# Patient Record
Sex: Male | Born: 1990 | Race: White | Hispanic: No | Marital: Married | Smoking: Former smoker
Health system: Southern US, Community
[De-identification: ages and names within clinical notes are randomized; demographics above are authoritative.]

## PROBLEM LIST (undated history)

## (undated) DIAGNOSIS — K219 Gastro-esophageal reflux disease without esophagitis: Secondary | ICD-10-CM

## (undated) HISTORY — DX: Gastro-esophageal reflux disease without esophagitis: K21.9

---

## 2018-08-21 DIAGNOSIS — F17200 Nicotine dependence, unspecified, uncomplicated: Secondary | ICD-10-CM | POA: Insufficient documentation

## 2018-08-21 DIAGNOSIS — R2232 Localized swelling, mass and lump, left upper limb: Secondary | ICD-10-CM | POA: Insufficient documentation

## 2018-08-22 ENCOUNTER — Emergency Department (HOSPITAL_COMMUNITY)
Admission: EM | Admit: 2018-08-22 | Discharge: 2018-08-22 | Disposition: A | Discharge status: Home / Self Care | Payer: Self-pay | Attending: Emergency Medicine | Admitting: Emergency Medicine

## 2018-08-22 ENCOUNTER — Emergency Department (HOSPITAL_COMMUNITY): Payer: Self-pay

## 2018-08-22 ENCOUNTER — Other Ambulatory Visit: Payer: Self-pay

## 2018-08-22 ENCOUNTER — Encounter (HOSPITAL_COMMUNITY): Payer: Self-pay | Admitting: *Deleted

## 2018-08-22 DIAGNOSIS — M7989 Other specified soft tissue disorders: Secondary | ICD-10-CM

## 2018-08-22 HISTORY — DX: Gastro-esophageal reflux disease without esophagitis: K21.9

## 2018-08-22 NOTE — ED Provider Notes (Signed)
[facility] EMERGENCY DEPARTMENT Provider Note   CSN: [id number] Arrival date & time: 08/21/18  2356     History   Chief Complaint Chief Complaint  Patient presents with  . Hand Pain    HPI Jeremiah Roberts is a 27 y.o. male.  Patient presents with pain and swelling to his left thumb that is progressively worsening over the past 1 month.  States he had a saw injury to his thumb many years ago in [place] that was surgically reconstructed.  Since then he has had a firm area of "scar tissue" to the palmar surface of his left thumb.  Over the past month at least this is gotten progressively larger in size and become sensitive to touch.  No bleeding or drainage.  No fever.  No focal weakness, numbness or tingling.  Dates he did accidentally hit this time with a hammer several weeks ago but did not injure this particular area.  No pain with range of motion.  The history is provided by the patient.  Hand Pain  Pertinent negatives include no chest pain, no abdominal pain and no shortness of breath.    Past Medical History:  Diagnosis Date  . Acid reflux     There are no active problems to display for this patient.   History reviewed. No pertinent surgical history.      Home Medications    Prior to Admission medications   Not on File    Family History History reviewed. No pertinent family history.  Social History Social History   Tobacco Use  . Smoking status: Current Every Day Smoker    Types: Cigars  . Smokeless tobacco: Never Used  Substance Use Topics  . Alcohol use: Yes    Comment: occasionally  . Drug use: Never     Allergies   Patient has no known allergies.   Review of Systems Review of Systems  Constitutional: Negative for activity change, appetite change and fever.  HENT: Negative for congestion.   Eyes: Negative for visual disturbance.  Respiratory: Negative for cough, chest tightness and shortness of breath.   Cardiovascular: Negative for chest  pain.  Gastrointestinal: Negative for abdominal pain, nausea and vomiting.  Genitourinary: Negative for dysuria, hematuria and urgency.  Musculoskeletal: Positive for arthralgias and myalgias. Negative for neck stiffness.  Skin: Positive for wound.  Neurological: Negative for dizziness, weakness, light-headedness and numbness.    all other systems are negative except as noted in the HPI and PMH.    Physical Exam Updated Vital Signs BP (!) 118/93 (BP Location: Right Arm)   Pulse 62   Temp 98.1 F (36.7 C) (Oral)   Resp 16   Ht 6\' 1"  (1.854 m)   SpO2 99%   Physical Exam  Constitutional: He is oriented to person, place, and time. He appears well-developed and well-nourished. No distress.  HENT:  Head: Normocephalic and atraumatic.  Mouth/Throat: Oropharynx is clear and moist. No oropharyngeal exudate.  Eyes: Pupils are equal, round, and reactive to light. Conjunctivae and EOM are normal.  Neck: Normal range of motion. Neck supple.  No meningismus.  Cardiovascular: Normal rate, regular rhythm, normal heart sounds and intact distal pulses.  No murmur heard. Pulmonary/Chest: Effort normal and breath sounds normal. No respiratory distress.  Abdominal: Soft. There is no tenderness. There is no rebound and no guarding.  Musculoskeletal: Normal range of motion. He exhibits tenderness. He exhibits no edema.  Left palmar thumb has a 0.5 cm firm nodule versus cyst.  There is  no erythema or fluctuance. Full range of motion of IP and MCP joint. Intact radial pulse.  Neurological: He is alert and oriented to person, place, and time. No cranial nerve deficit. He exhibits normal muscle tone. Coordination normal.  No ataxia on finger to nose bilaterally. No pronator drift. 5/5 strength throughout. CN 2-12 intact.Equal grip strength. Sensation intact.   Skin: Skin is warm.  Psychiatric: He has a normal mood and affect. His behavior is normal.  Nursing note and vitals reviewed.      ED  Treatments / Results  Labs (all labs ordered are listed, but only abnormal results are displayed) Labs Reviewed - No data to display  EKG None  Radiology No results found.  Procedures Procedures (including critical care time)  Medications Ordered in ED Medications - No data to display   Initial Impression / Assessment and Plan / ED Course  I have reviewed the triage vital signs and the nursing notes.  Pertinent labs & imaging results that were available during my care of the patient were reviewed by me and considered in my medical decision making (see chart for details).    Pain and swelling to left thumb after having surgery many years ago.  There is no fluctuance or erythema.  Neurovascularly intact.  Exam appears consistent with likely scar tissue versus cyst.  Does not appear to be abscess. X-ray shows no foreign body.  Discussed follow-up with hand surgery for elective excision.  Return precautions discussed.  Final Clinical Impressions(s) / ED Diagnoses   Final diagnoses:  Swelling of left thumb    ED Discharge Orders    None       Bjorn Hallas, Jeannett Senior, MD 08/22/18 0145

## 2018-08-22 NOTE — ED Triage Notes (Signed)
Pt c/o pain to right thumb; pt has swelling to right thumb

## 2018-08-22 NOTE — Discharge Instructions (Addendum)
Follow-up with the hand doctor if you want this cyst removed.  There is no evidence of infection.  Your x-ray does not show any fractures dislocation.  Return to the ED with new or worsening symptoms.

## 2018-12-30 ENCOUNTER — Emergency Department (HOSPITAL_COMMUNITY)
Admission: EM | Admit: 2018-12-30 | Discharge: 2018-12-30 | Disposition: A | Discharge status: Home / Self Care | Payer: Self-pay | Attending: Emergency Medicine | Admitting: Emergency Medicine

## 2018-12-30 ENCOUNTER — Other Ambulatory Visit: Payer: Self-pay

## 2018-12-30 ENCOUNTER — Encounter (HOSPITAL_COMMUNITY): Payer: Self-pay | Admitting: Emergency Medicine

## 2018-12-30 DIAGNOSIS — M79604 Pain in right leg: Secondary | ICD-10-CM

## 2018-12-30 DIAGNOSIS — F17290 Nicotine dependence, other tobacco product, uncomplicated: Secondary | ICD-10-CM | POA: Insufficient documentation

## 2018-12-30 MED ORDER — CEPHALEXIN 500 MG PO CAPS: 500.0000 mg | ORAL_CAPSULE | Freq: Four times a day (QID) | ORAL | 0 refills | Status: DC

## 2018-12-30 NOTE — Discharge Instructions (Signed)
Take Keflex for the next 5 days Apply ice and heat to the area Take Ibuprofen Return if worsening

## 2018-12-30 NOTE — ED Triage Notes (Signed)
Pt noticed pain to right leg just below knee, area is red, increased warmth. Possible bite, abscess, pt unsure of the source.

## 2018-12-30 NOTE — ED Provider Notes (Signed)
[facility] EMERGENCY DEPARTMENT Provider Note   CSN: [id number] Arrival date & time: 12/30/18  2124     History   Chief Complaint Chief Complaint  Patient presents with  . Leg Pain    ?abcess/insect bite    HPI Bettey MareDavid Kielty is a 28 y.o. male who presents with pain and redness over his right shin. No significant PMH. He states it started three days ago. The pain has been gradually worsening. It's worse with walking. Better with rest. It feels like getting "hit with a steel pipe" every time he takes a step. He denies injury to the area. It was more swollen initially but that has improved but the pain has worsened. He got a new tattoo on that leg but there is no pain over the tattoo. No fever, numbness, tingling.  HPI  Past Medical History:  Diagnosis Date  . Acid reflux     There are no active problems to display for this patient.   History reviewed. No pertinent surgical history.      Home Medications    Prior to Admission medications   Medication Sig Start Date End Date Taking? Authorizing Provider  cephALEXin (KEFLEX) 500 MG capsule Take 1 capsule (500 mg total) by mouth 4 (four) times daily. 12/30/18   Bethel BornGekas, Kirandeep Fariss Marie, PA-C    Family History History reviewed. No pertinent family history.  Social History Social History   Tobacco Use  . Smoking status: Current Every Day Smoker    Types: Cigars  . Smokeless tobacco: Never Used  Substance Use Topics  . Alcohol use: Yes    Comment: occasionally  . Drug use: Never     Allergies   Patient has no known allergies.   Review of Systems Review of Systems  Constitutional: Negative for fever.  Skin: Positive for color change. Negative for wound.     Physical Exam Updated Vital Signs BP 132/77 (BP Location: Right Arm)   Pulse 99   Temp 97.9 F (36.6 C) (Oral)   Resp 17   Ht 6\' 1"  (1.854 m)   Wt 90.7 kg   SpO2 98%   BMI 26.39 kg/m   Physical Exam Vitals signs and nursing note reviewed.    Constitutional:      General: He is not in acute distress.    Appearance: Normal appearance. He is well-developed.  HENT:     Head: Normocephalic and atraumatic.  Eyes:     General: No scleral icterus.       Right eye: No discharge.        Left eye: No discharge.     Conjunctiva/sclera: Conjunctivae normal.     Pupils: Pupils are equal, round, and reactive to light.  Neck:     Musculoskeletal: Normal range of motion.  Cardiovascular:     Rate and Rhythm: Normal rate.  Pulmonary:     Effort: Pulmonary effort is normal. No respiratory distress.  Abdominal:     General: There is no distension.  Musculoskeletal:     Comments: Area of very mild redness, swelling and induration over the right shin, just below the knee. There is no obvious bite marks over the area however there is som scabbing over the shin below this area. There is a fresh tattoo over his medial calf. No calf tenderness. Compartments are soft.  Skin:    General: Skin is warm and dry.  Neurological:     Mental Status: He is alert and oriented to person, place, and time.  Psychiatric:        Behavior: Behavior normal.      ED Treatments / Results  Labs (all labs ordered are listed, but only abnormal results are displayed) Labs Reviewed - No data to display  EKG None  Radiology No results found.  Procedures Procedures (including critical care time)  Medications Ordered in ED Medications - No data to display   Initial Impression / Assessment and Plan / ED Course  I have reviewed the triage vital signs and the nursing notes.  Pertinent labs & imaging results that were available during my care of the patient were reviewed by me and considered in my medical decision making (see chart for details).  28 year old male presents with redness, swelling, pain over the shin for the past couple of days. Pain is moderate-severe. Unclear etiology. Doubt DVT as the area is contained to the shin and he has no calf  tenderness. Possibly cellulitis? Will treat for skin infection in the setting of recent tattoo, redness, pain, and advised return if worsening.  Final Clinical Impressions(s) / ED Diagnoses   Final diagnoses:  Right leg pain    ED Discharge Orders         Ordered    cephALEXin (KEFLEX) 500 MG capsule  4 times daily     12/30/18 2310           Bethel Born, Cordelia Poche 12/30/18 2316    Donnetta Hutching, MD 12/31/18 [phone]

## 2019-07-03 IMAGING — DX DG FINGER THUMB 2+V*L*
3 series · 3 of 3 positions shown · non-contrast
Comparison: None.

CLINICAL DATA: 27-year-old male with swelling of the right thumb.

EXAM:
LEFT THUMB 2+V

[finger ap]
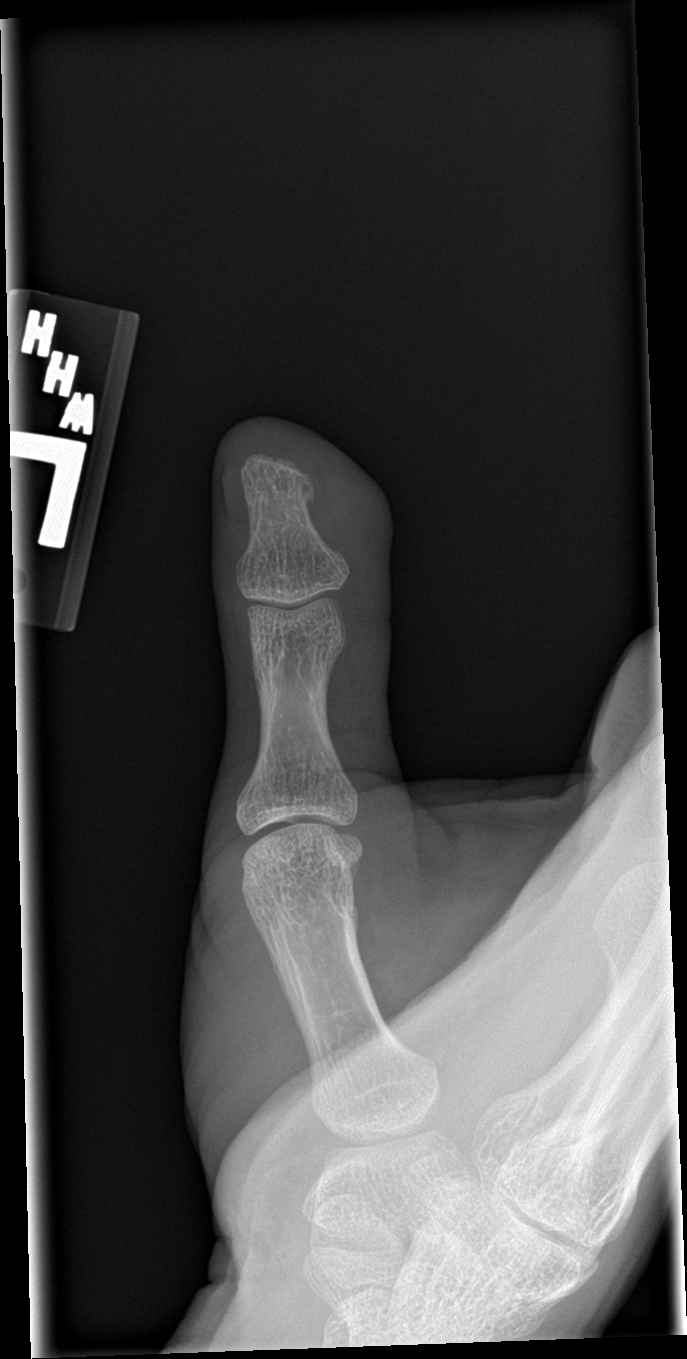

[finger obl]
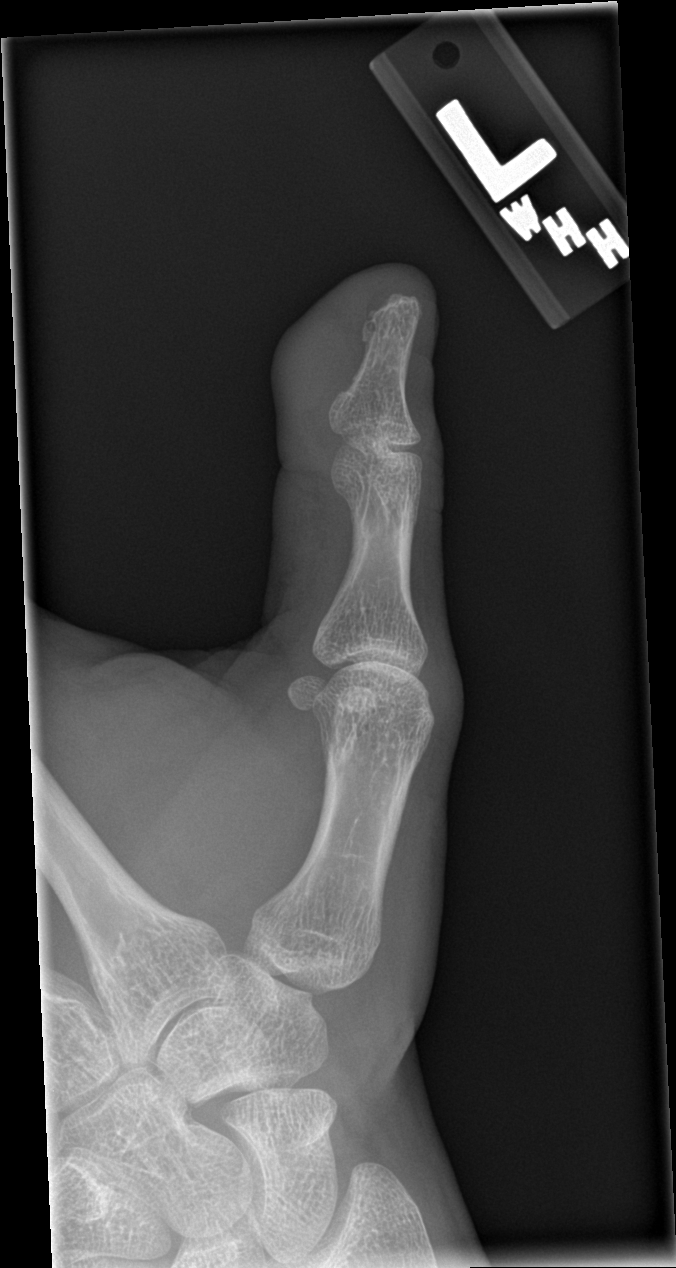

[finger lat]
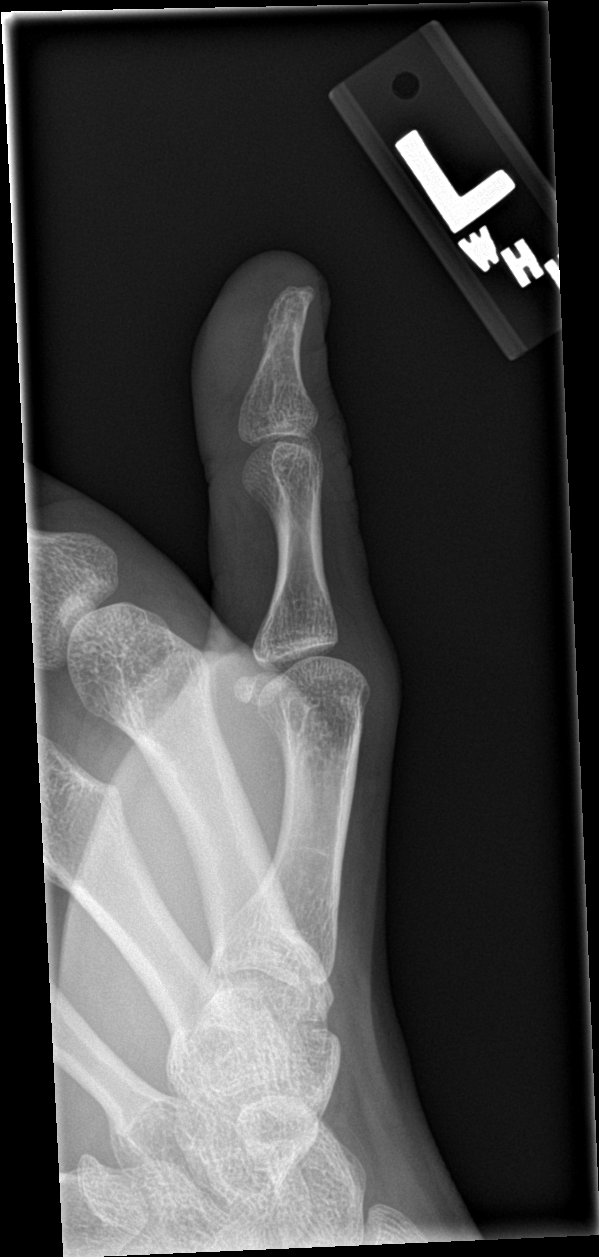

[3 of 3 positions shown; findings below may reference images not displayed]

FINDINGS: There is no acute fracture or dislocation. The bones are well
mineralized. No arthritic changes. There is soft tissue swelling of
the volar aspect of the distal thumb. No radiopaque foreign object
or soft tissue gas.
IMPRESSION: Focal soft tissue swelling of the plantar aspect of the distal
thumb. No acute osseous pathology.

## 2020-09-18 ENCOUNTER — Emergency Department (HOSPITAL_COMMUNITY)
Admission: EM | Admit: 2020-09-18 | Discharge: 2020-09-18 | Disposition: A | Discharge status: Home / Self Care | Payer: Self-pay | Attending: Emergency Medicine | Admitting: Emergency Medicine

## 2020-09-18 ENCOUNTER — Emergency Department (HOSPITAL_COMMUNITY)
Admission: EM | Admit: 2020-09-18 | Discharge: 2020-09-19 | Disposition: A | Discharge status: Home / Self Care | Payer: Self-pay | Attending: Emergency Medicine | Admitting: Emergency Medicine

## 2020-09-18 ENCOUNTER — Encounter (HOSPITAL_COMMUNITY): Payer: Self-pay

## 2020-09-18 ENCOUNTER — Other Ambulatory Visit: Payer: Self-pay

## 2020-09-18 DIAGNOSIS — R110 Nausea: Secondary | ICD-10-CM | POA: Insufficient documentation

## 2020-09-18 DIAGNOSIS — Z5321 Procedure and treatment not carried out due to patient leaving prior to being seen by health care provider: Secondary | ICD-10-CM | POA: Insufficient documentation

## 2020-09-18 NOTE — ED Triage Notes (Signed)
Pt reports eating something bad Monday night and feeling nauseated ever since.

## 2020-09-18 NOTE — ED Notes (Signed)
Pt eloped prior to triage.

## 2020-09-19 NOTE — ED Notes (Signed)
Pt eloped from waiting area.  

## 2020-09-30 ENCOUNTER — Ambulatory Visit: Payer: Self-pay | Admitting: *Deleted

## 2020-09-30 NOTE — Telephone Encounter (Signed)
Today is day 5 after quitting smoking cold [place] without any medications. No shakes/HA/chills/nerves at this time.  Mild abdominal cramping with bloating. Loose stools after cramping last night-used imodium. Denies CP/SOB/fever. No difficulty voiding.  Care Advice including drink plenty of water/fluids daily, eat mild foods until stomach issues resolve. Pepto for cramping/bloating/nausea. Emetrol OTC for nausea. If no improvement or worsens within the week call back or seek treatment at the UC/ED.Congratulated  him for quitting.   Reason for Disposition  [1] MILD-MODERATE pain AND [2] comes and goes (cramps)  Answer Assessment - Initial Assessment Questions 1. LOCATION: "Where does it hurt?"     Center of abdomen. 2. RADIATION: "Does the pain shoot anywhere else?" (e.g., chest, back)     no 3. ONSET: "When did the pain begin?" (Minutes, hours or days ago)      3-4 days ago 4. SUDDEN: "Gradual or sudden onset?"     gradual 5. PATTERN "Does the pain come and go, or is it constant?"    - If constant: "Is it getting better, staying the same, or worsening?"      (Note: Constant means the pain never goes away completely; most serious pain is constant and it progresses)     - If intermittent: "How long does it last?" "Do you have pain now?"     (Note: Intermittent means the pain goes away completely between bouts)     intermittent 6. SEVERITY: "How bad is the pain?"  (e.g., Scale 1-10; mild, moderate, or severe)    - MILD (1-3): doesn't interfere with normal activities, abdomen soft and not tender to touch     - MODERATE (4-7): interferes with normal activities or awakens from sleep, tender to touch     - SEVERE (8-10): excruciating pain, doubled over, unable to do any normal activities       At its worse last night-4 on the scale 7. RECURRENT SYMPTOM: "Have you ever had this type of stomach pain before?" If Yes, ask: "When was the last time?" and "What happened that time?"      no 8. CAUSE:  "What do you think is causing the stomach pain?"    Quit smoking cold [place] 6 days ago. 9. RELIEVING/AGGRAVATING FACTORS: "What makes it better or worse?" (e.g., movement, antacids, bowel movement)     Have not tried anything but imodium for loose stool last night. 10. OTHER SYMPTOMS: "Has there been any vomiting, diarrhea, constipation, or urine problems?"       Bloating/nausea at times with eating.  Protocols used: ABDOMINAL PAIN - MALE-A-AH

## 2020-10-14 ENCOUNTER — Emergency Department (HOSPITAL_COMMUNITY)
Admission: EM | Admit: 2020-10-14 | Discharge: 2020-10-14 | Disposition: A | Discharge status: Home / Self Care | Payer: Self-pay | Attending: Emergency Medicine | Admitting: Emergency Medicine

## 2020-10-14 ENCOUNTER — Other Ambulatory Visit: Payer: Self-pay

## 2020-10-14 ENCOUNTER — Encounter (HOSPITAL_COMMUNITY): Payer: Self-pay | Admitting: Emergency Medicine

## 2020-10-14 DIAGNOSIS — F419 Anxiety disorder, unspecified: Secondary | ICD-10-CM | POA: Insufficient documentation

## 2020-10-14 DIAGNOSIS — F600 Paranoid personality disorder: Secondary | ICD-10-CM | POA: Insufficient documentation

## 2020-10-14 DIAGNOSIS — R197 Diarrhea, unspecified: Secondary | ICD-10-CM | POA: Insufficient documentation

## 2020-10-14 DIAGNOSIS — R630 Anorexia: Secondary | ICD-10-CM | POA: Insufficient documentation

## 2020-10-14 DIAGNOSIS — F17290 Nicotine dependence, other tobacco product, uncomplicated: Secondary | ICD-10-CM | POA: Insufficient documentation

## 2020-10-14 DIAGNOSIS — R110 Nausea: Secondary | ICD-10-CM | POA: Insufficient documentation

## 2020-10-14 LAB — CBC WITH DIFFERENTIAL/PLATELET
Abs Immature Granulocytes: 0.03 10*3/uL (ref 0.00–0.07)
Basophils Absolute: 0 10*3/uL (ref 0.0–0.1)
Basophils Relative: 0 %
Eosinophils Absolute: 0.2 10*3/uL (ref 0.0–0.5)
Eosinophils Relative: 3 %
HCT: 44 % (ref 39.0–52.0)
Hemoglobin: 15 g/dL (ref 13.0–17.0)
Immature Granulocytes: 0 %
Lymphocytes Relative: 26 %
Lymphs Abs: 1.8 10*3/uL (ref 0.7–4.0)
MCH: 31.3 pg (ref 26.0–34.0)
MCHC: 34.1 g/dL (ref 30.0–36.0)
MCV: 91.7 fL (ref 80.0–100.0)
Monocytes Absolute: 1 10*3/uL (ref 0.1–1.0)
Monocytes Relative: 13 %
Neutro Abs: 4.1 10*3/uL (ref 1.7–7.7)
Neutrophils Relative %: 58 %
Platelets: 209 10*3/uL (ref 150–400)
RBC: 4.8 MIL/uL (ref 4.22–5.81)
RDW: 11.3 % — ABNORMAL LOW (ref 11.5–15.5)
WBC: 7.2 10*3/uL (ref 4.0–10.5)
nRBC: 0 % (ref 0.0–0.2)

## 2020-10-14 LAB — LIPASE, BLOOD: Lipase: 30 U/L (ref 11–51)

## 2020-10-14 LAB — COMPREHENSIVE METABOLIC PANEL
ALT: 18 U/L (ref 0–44)
AST: 20 U/L (ref 15–41)
Albumin: 4.9 g/dL (ref 3.5–5.0)
Alkaline Phosphatase: 80 U/L (ref 38–126)
Anion gap: 9 (ref 5–15)
BUN: 13 mg/dL (ref 6–20)
CO2: 27 mmol/L (ref 22–32)
Calcium: 9.2 mg/dL (ref 8.9–10.3)
Chloride: 103 mmol/L (ref 98–111)
Creatinine, Ser: 0.97 mg/dL (ref 0.61–1.24)
GFR, Estimated: >60 mL/min (ref >60)
Glucose, Bld: 97 mg/dL (ref 70–99)
Potassium: 3.8 mmol/L (ref 3.5–5.1)
Sodium: 139 mmol/L (ref 135–145)
Total Bilirubin: 1.1 mg/dL (ref 0.3–1.2)
Total Protein: 7.3 g/dL (ref 6.5–8.1)

## 2020-10-14 LAB — RAPID URINE DRUG SCREEN, HOSP PERFORMED
Amphetamines: NOT DETECTED
Barbiturates: NOT DETECTED
Benzodiazepines: NOT DETECTED
Cocaine: NOT DETECTED
Opiates: NOT DETECTED
Tetrahydrocannabinol: NOT DETECTED

## 2020-10-14 LAB — TROPONIN I (HIGH SENSITIVITY): Troponin I (High Sensitivity): 4 ng/L (ref <18)

## 2020-10-14 MED ORDER — ALUM & MAG HYDROXIDE-SIMETH 400-400-40 MG/5ML PO SUSP: 10.0000 mL | Freq: Four times a day (QID) | ORAL | 0 refills | Status: DC | PRN

## 2020-10-14 MED ORDER — LORAZEPAM 1 MG PO TABS
1.0000 mg | ORAL_TABLET | Freq: Once | ORAL | Status: AC
  Administered 2020-10-14: 1 mg via ORAL
  Filled 2020-10-14: qty 1

## 2020-10-14 MED ORDER — ONDANSETRON 4 MG PO TBDP
4.0000 mg | ORAL_TABLET | Freq: Once | ORAL | Status: AC
  Administered 2020-10-14: 4 mg via ORAL
  Filled 2020-10-14: qty 1

## 2020-10-14 MED ORDER — HYDROXYZINE HCL 25 MG PO TABS: 25.0000 mg | ORAL_TABLET | Freq: Four times a day (QID) | ORAL | 0 refills | Status: DC | PRN

## 2020-10-14 MED ORDER — ONDANSETRON 4 MG PO TBDP: 4.0000 mg | ORAL_TABLET | Freq: Three times a day (TID) | ORAL | 0 refills | Status: AC | PRN

## 2020-10-14 NOTE — ED Provider Notes (Signed)
Emergency Department Provider Note   I have reviewed the triage vital signs and the nursing notes.   HISTORY  Chief Complaint Anxiety   HPI Jeremiah Roberts is a 29 y.o. male presents to the emergency department with decreased appetite, nausea, paranoia.  Symptoms have been worsening over the past month.  Patient states that he had a night with lots of dry heaving and some diarrhea.  He states that after this he decided to quit smoking without nicotine aid.  He states that since the vomiting episode he has been very paranoid about eating anything other than subsandwiches.  He states that even the thought of other food makes him very nauseated.  He is able to eat subs but states he is getting tired of this and is feeling anxious and paranoid with thoughts centering around food.  He is not feeling thoughts of harming himself or other people.  He is not experiencing auditory or visual hallucinations.  He denies drug or alcohol use.  He does use a vape pen and uses nicotine free cartridges. No radiation of symptoms or modifying factors.   Past Medical History:  Diagnosis Date  . Acid reflux     There are no problems to display for this patient.   History reviewed. No pertinent surgical history.  Allergies Patient has no known allergies.  History reviewed. No pertinent family history.  Social History Social History   Tobacco Use  . Smoking status: Current Every Day Smoker    Types: Cigars  . Smokeless tobacco: Never Used  Vaping Use  . Vaping Use: Never used  Substance Use Topics  . Alcohol use: Yes    Comment: occasionally  . Drug use: Never    Review of Systems  Constitutional: No fever/chills Eyes: No visual changes. ENT: No sore throat. Cardiovascular: Denies chest pain. Respiratory: Denies shortness of breath. Gastrointestinal: No abdominal pain. Positive nausea, no vomiting.  No diarrhea.  No constipation. Poor appetite.  Genitourinary: Negative for  dysuria. Musculoskeletal: Negative for back pain. Skin: Negative for rash. Neurological: Negative for headaches. Psychiatric: Positive paranoid thinking and anxiety symptoms.   10-point ROS otherwise negative.  ____________________________________________   PHYSICAL EXAM:  VITAL SIGNS: ED Triage Vitals  Enc Vitals Group     BP 10/14/20 1940 114/84     Pulse Rate 10/14/20 1940 66     Resp 10/14/20 1940 18     Temp 10/14/20 1940 98.4 F (36.9 C)     Temp Source 10/14/20 1940 Oral     SpO2 10/14/20 1940 97 %     Weight 10/14/20 1943 190 lb (86.2 kg)     Height 10/14/20 1943 6\' 1"  (1.854 m)    Constitutional: Alert and oriented. Well appearing and in no acute distress. Eyes: Conjunctivae are normal.  Head: Atraumatic. Nose: No congestion/rhinnorhea. Mouth/Throat: Mucous membranes are moist.   Neck: No stridor.  Cardiovascular: Normal rate, regular rhythm. Good peripheral circulation. Grossly normal heart sounds.   Respiratory: Normal respiratory effort.  No retractions. Lungs CTAB. Gastrointestinal: Soft and nontender. No distention.  Musculoskeletal: No lower extremity tenderness nor edema. No gross deformities of extremities. Neurologic:  Normal speech and language. No gross focal neurologic deficits are appreciated.  Skin:  Skin is warm, dry and intact. No rash noted. Psychiatric: Mood and affect are slightly anxious but overall appropriate. Speech and behavior are normal. Not responding to internal stimuli.   ____________________________________________   LABS (all labs ordered are listed, but only abnormal results are displayed)  Labs Reviewed  CBC WITH DIFFERENTIAL/PLATELET - Abnormal; Notable for the following components:      Result Value   RDW 11.3 (*)    All other components within normal limits  COMPREHENSIVE METABOLIC PANEL  LIPASE, BLOOD  RAPID URINE DRUG SCREEN, HOSP PERFORMED  TROPONIN I (HIGH SENSITIVITY)  TROPONIN I (HIGH SENSITIVITY)    ____________________________________________  EKG   EKG Interpretation  Date/Time:  Monday October 14 2020 20:11:09 EDT Ventricular Rate:  70 PR Interval:    QRS Duration: 105 QT Interval:  381 QTC Calculation: 412 R Axis:   80 Text Interpretation: Sinus rhythm repeat Confirmed by Margarita Grizzle [phone]) on 10/15/2020 3:37:21 PM       ____________________________________________  RADIOLOGY  None ____________________________________________   PROCEDURES  Procedure(s) performed:   Procedures  None ____________________________________________   INITIAL IMPRESSION / ASSESSMENT AND PLAN / ED COURSE  Pertinent labs & imaging results that were available during my care of the patient were reviewed by me and considered in my medical decision making (see chart for details).   Patient presents to the emergency department with symptoms seeming most consistent with anxiety.  He is having issues with eating and many of his anxiety symptoms center around food intake.  He has a nontender abdomen and overall unremarkable vital signs.  Plan for screening blood work, troponin, EKG which is reviewed as above, and will give Ativan along with Zofran here.  I do not see an indication for IVC or emergent psychiatry evaluation but will provide resources at the time of discharge.  Labs and imaging reviewed. No acute findings. Plan for PCP and Psychiatry f/u. Info provided at discharge.  ____________________________________________  FINAL CLINICAL IMPRESSION(S) / ED DIAGNOSES  Final diagnoses:  Anxiety     MEDICATIONS GIVEN DURING THIS VISIT:  Medications  LORazepam (ATIVAN) tablet 1 mg (1 mg Oral Given 10/14/20 2111)  ondansetron (ZOFRAN-ODT) disintegrating tablet 4 mg (4 mg Oral Given 10/14/20 2111)     NEW OUTPATIENT MEDICATIONS STARTED DURING THIS VISIT:  Discharge Medication List as of 10/14/2020 10:51 PM    START taking these medications   Details  alum & mag  hydroxide-simeth (MAALOX MAX) 400-400-40 MG/5ML suspension Take 10 mLs by mouth every 6 (six) hours as needed for indigestion., Starting Mon 10/14/2020, Normal    hydrOXYzine (ATARAX/VISTARIL) 25 MG tablet Take 1 tablet (25 mg total) by mouth every 6 (six) hours as needed for anxiety., Starting Mon 10/14/2020, Normal    ondansetron (ZOFRAN ODT) 4 MG disintegrating tablet Take 1 tablet (4 mg total) by mouth every 8 (eight) hours as needed for nausea or vomiting., Starting Mon 10/14/2020, Normal        Note:  This document was prepared using Dragon voice recognition software and may include unintentional dictation errors.  Alona Bene, MD, [facility]    Liadan Guizar, Arlyss Repress, MD 10/15/20 [phone]

## 2020-10-14 NOTE — ED Triage Notes (Signed)
Patient is complaining of paranoia, abdominal pain when he eat, anxiety, and patient is rocking when talking to him. Patient quit smoking last month and that is when these symptoms started.

## 2020-10-14 NOTE — ED Notes (Signed)
Wife at Bedside

## 2020-10-14 NOTE — Discharge Instructions (Signed)
You were seen in the emergency department today with anxiety-like symptoms.  I have called in medications for your reflux and nausea to the pharmacy.  Have also called in some medicine to take as needed for anxiety.  Please call the behavioral health team listed to schedule an outpatient appointment.  Present to the emergency department any new or suddenly worsening symptoms.

## 2020-10-20 ENCOUNTER — Emergency Department (HOSPITAL_COMMUNITY)
Admission: EM | Admit: 2020-10-20 | Discharge: 2020-10-21 | Disposition: A | Discharge status: Home / Self Care | Payer: Self-pay | Attending: Emergency Medicine | Admitting: Emergency Medicine

## 2020-10-20 ENCOUNTER — Encounter (HOSPITAL_COMMUNITY): Payer: Self-pay | Admitting: Emergency Medicine

## 2020-10-20 ENCOUNTER — Other Ambulatory Visit: Payer: Self-pay

## 2020-10-20 DIAGNOSIS — Z20822 Contact with and (suspected) exposure to covid-19: Secondary | ICD-10-CM | POA: Insufficient documentation

## 2020-10-20 DIAGNOSIS — F17290 Nicotine dependence, other tobacco product, uncomplicated: Secondary | ICD-10-CM | POA: Insufficient documentation

## 2020-10-20 DIAGNOSIS — R112 Nausea with vomiting, unspecified: Secondary | ICD-10-CM | POA: Insufficient documentation

## 2020-10-20 DIAGNOSIS — F22 Delusional disorders: Secondary | ICD-10-CM | POA: Insufficient documentation

## 2020-10-20 DIAGNOSIS — K591 Functional diarrhea: Secondary | ICD-10-CM | POA: Insufficient documentation

## 2020-10-20 DIAGNOSIS — R109 Unspecified abdominal pain: Secondary | ICD-10-CM | POA: Insufficient documentation

## 2020-10-20 DIAGNOSIS — F32A Depression, unspecified: Secondary | ICD-10-CM | POA: Insufficient documentation

## 2020-10-20 LAB — URINALYSIS, ROUTINE W REFLEX MICROSCOPIC
Bacteria, UA: NONE SEEN
Bilirubin Urine: NEGATIVE
Glucose, UA: NEGATIVE mg/dL
Ketones, ur: NEGATIVE mg/dL
Leukocytes,Ua: NEGATIVE
Nitrite: NEGATIVE
Protein, ur: NEGATIVE mg/dL
Specific Gravity, Urine: 1.008 (ref 1.005–1.030)
pH: 6 (ref 5.0–8.0)

## 2020-10-20 MED ORDER — LOPERAMIDE HCL 2 MG PO CAPS
4.0000 mg | ORAL_CAPSULE | Freq: Once | ORAL | Status: AC
  Administered 2020-10-20: 4 mg via ORAL
  Filled 2020-10-20: qty 2

## 2020-10-20 MED ORDER — ONDANSETRON HCL 4 MG PO TABS
4.0000 mg | ORAL_TABLET | Freq: Once | ORAL | Status: AC
  Administered 2020-10-20: 4 mg via ORAL
  Filled 2020-10-20: qty 1

## 2020-10-20 NOTE — ED Notes (Signed)
Report called to Karolee Stamps, RN at [facility]

## 2020-10-20 NOTE — ED Provider Notes (Signed)
[facility]-EMERGENCY DEPT Provider Note   CSN: [id number] Arrival date & time: 10/20/20  2102     History Chief Complaint  Patient presents with  . Diarrhea    Jeremiah Roberts is a 29 y.o. male with a history of anxiety and depression who presents to the emergency department with a chief complaint of diarrhea.  The patient reports that he has had approximately 3 episodes of watery diarrhea over the last 4 days.  He reports associated nausea and intermittent abdominal cramping.  Symptoms seem to be brought on by taking his home sertraline and resolved when he has not taken the medication.  He denies vomiting, constipation, fever, chills, cough, shortness of breath, loss of sense of taste or smell, dysuria, hematuria, flank pain, penile or testicular pain or swelling.  He came to the emergency department for further evaluation after he called the on-call nursing line and described his symptoms and was advised to come to the emergency department for further work-up and evaluation.  No recent antibiotic administration, camping, beach trips, recent travel.  No known sick contacts.  He reports that he was started on sertraline 4 days ago through [employer], a telehealth service for behavioral health.  He has not established with behavioral health or psychiatry in the [place] area.  For the last month, the patient has been increasingly paranoid about eating food because he afraid he will get sick.  His paranoia stems around the fact that he had a night with lots of vomiting, dry heaving, abdominal pain, and diarrhea after he decided to quit smoking tobacco without a nicotine aid.  He states that he has lost 40 pounds over the last month because he has been very anxious about eating.  States that he may eat 1 sandwich a day; however, he has been able to drink fluids without difficulty.  Since his symptoms began, he has presented at 4 times to the ER, including today.  Previously,  he had not been seen in the ER in more than a year and a half.  He was last seen in the ER on 10/25 and received some good relief temporarily after he was given a dose of Ativan in the ER.  However, he was discharged with ODT Zofran, Maalox, and hydroxyzine, but felt more nauseated after taking the ODT Zofran due to the taste and felt "weird" after taking the hydroxyzine.  Other stressors included that he did start a new job about a month ago.  He has missed several days of work due to feeling poorly and having transportation issues as he does not drive.  His wife also reports that he has been more stressed about housing issues as they have been living with his parents for the last 2 years and were recently told that they will have to find a new place to live in the next few months.   He feels that his paranoia has been impacting his life at home and at work.  He states "I feel terrible, and I just want to get back to feeling like myself."  He denies SI or HI.  When asked about auditory or visual hallucinations, he does not answer.  He denies illicit or recreational substance use.  The history is provided by the patient and medical records. No language interpreter was used.       Past Medical History:  Diagnosis Date  . Acid reflux     There are no problems to display for this patient.  History reviewed. No pertinent surgical history.     History reviewed. No pertinent family history.  Social History   Tobacco Use  . Smoking status: Current Every Day Smoker    Types: Cigars  . Smokeless tobacco: Never Used  Vaping Use  . Vaping Use: Never used  Substance Use Topics  . Alcohol use: Yes    Comment: occasionally  . Drug use: Never    Home Medications Prior to Admission medications   Medication Sig Start Date End Date Taking? Authorizing Provider  alum & mag hydroxide-simeth (MAALOX MAX) 400-400-40 MG/5ML suspension Take 10 mLs by mouth every 6 (six) hours as needed for  indigestion. 10/14/20   Long, Arlyss Repress, MD  bismuth subsalicylate (PEPTO BISMOL) 262 MG/15ML suspension Take 15 mLs by mouth every 6 (six) hours as needed for indigestion.    [provider]  cephALEXin (KEFLEX) 500 MG capsule Take 1 capsule (500 mg total) by mouth 4 (four) times daily. Patient not taking: Reported on 10/14/2020 12/30/18   Bethel Born, PA-C  dimenhyDRINATE (DRAMAMINE PO) Take 2 tablets by mouth daily as needed (nausea).    [provider]  hydrOXYzine (ATARAX/VISTARIL) 25 MG tablet Take 1 tablet (25 mg total) by mouth every 6 (six) hours as needed for anxiety. 10/14/20   Long, Arlyss Repress, MD  ibuprofen (ADVIL) 200 MG tablet Take 200 mg by mouth every 6 (six) hours as needed for moderate pain.    [provider]  ondansetron (ZOFRAN ODT) 4 MG disintegrating tablet Take 1 tablet (4 mg total) by mouth every 8 (eight) hours as needed for nausea or vomiting. 10/14/20   Long, Arlyss Repress, MD    Allergies    Patient has no known allergies.  Review of Systems   Review of Systems  Constitutional: Negative for appetite change and fever.  Respiratory: Negative for shortness of breath.   Cardiovascular: Negative for chest pain.  Gastrointestinal: Positive for abdominal pain, diarrhea and nausea. Negative for constipation and vomiting.  Genitourinary: Negative for discharge, dysuria, flank pain, frequency, genital sores, penile pain, scrotal swelling and urgency.  Musculoskeletal: Negative for back pain, myalgias, neck pain and neck stiffness.  Skin: Negative for rash.  Allergic/Immunologic: Negative for immunocompromised state.  Neurological: Negative for seizures, syncope, weakness, numbness and headaches.  Psychiatric/Behavioral: Negative for confusion and suicidal ideas. The patient is nervous/anxious.     Physical Exam Updated Vital Signs BP (!) 149/83 (BP Location: Left Arm)   Pulse 73   Temp 98.1 F (36.7 C) (Oral)   Resp 16   Ht 6\' 1"  (1.854  m)   Wt 86.2 kg   SpO2 99%   BMI 25.07 kg/m   Physical Exam Vitals and nursing note reviewed.  Constitutional:      General: He is not in acute distress.    Appearance: He is well-developed. He is not ill-appearing, toxic-appearing or diaphoretic.  HENT:     Head: Normocephalic.     Mouth/Throat:     Mouth: Mucous membranes are moist.     Pharynx: No oropharyngeal exudate or posterior oropharyngeal erythema.     Comments: Mucous membranes are moist. Eyes:     Conjunctiva/sclera: Conjunctivae normal.  Cardiovascular:     Rate and Rhythm: Normal rate and regular rhythm.     Heart sounds: No murmur heard.   Pulmonary:     Effort: Pulmonary effort is normal. No respiratory distress.     Breath sounds: No stridor. No wheezing, rhonchi or rales.  Chest:  Chest wall: No tenderness.  Abdominal:     General: There is no distension.     Palpations: Abdomen is soft. There is no mass.     Tenderness: There is no abdominal tenderness. There is no right CVA tenderness, left CVA tenderness, guarding or rebound.     Hernia: No hernia is present.     Comments: Abdomen is soft, nontender, nondistended.  Normoactive bowel sounds.  No CVA tenderness bilaterally.  No tenderness over McBurney's point.  Negative Murphy sign.  Musculoskeletal:     Cervical back: Neck supple.     Right lower leg: No edema.     Left lower leg: No edema.  Skin:    General: Skin is warm and dry.     Capillary Refill: Capillary refill takes less than 2 seconds.  Neurological:     Mental Status: He is alert.  Psychiatric:        Mood and Affect: Mood is depressed. Affect is labile.        Speech: Speech normal.        Behavior: Behavior is withdrawn.        Thought Content: Thought content is paranoid. Thought content does not include homicidal or suicidal ideation. Thought content does not include homicidal or suicidal plan.     ED Results / Procedures / Treatments   Labs (all labs ordered are listed, but  only abnormal results are displayed) Labs Reviewed  RESPIRATORY PANEL BY RT PCR (FLU A&B, COVID)    EKG None  Radiology No results found.  Procedures Procedures (including critical care time)  Medications Ordered in ED Medications  loperamide (IMODIUM) capsule 4 mg (4 mg Oral Given 10/20/20 2308)  ondansetron (ZOFRAN) tablet 4 mg (4 mg Oral Given 10/20/20 2308)    ED Course  I have reviewed the triage vital signs and the nursing notes.  Pertinent labs & imaging results that were available during my care of the patient were reviewed by me and considered in my medical decision making (see chart for details).    MDM Rules/Calculators/A&P                          29 year old male with a history of depression anxiety who presents with 3 episodes of watery diarrhea over the last 4 days with intermittent nausea and abdominal cramping.  He was advised to come to the ER by a nursing triage line when he called earlier tonight.  Vital signs are normal.  His abdominal exam is benign and she clinically does not appear dehydrated.  I evaluated his labs from 6 days ago, which are normal.    I have a low suspicion for infectious diarrhea, including hemorrhagic E. coli, Salmonella, Giardia, viral gastroenteritis, or diverticulitis.  Patient does not have any risk factors for C. difficile.  Although has had a 40 pound weight loss, have a low suspicion for neoplastic process.  However, the patient is more concerned about increasing paranoia regarding food and eating for the last month, which has been accompanied by 40 pound weight loss.  The patient had a good night with multiple episodes of dry heaving, vomiting, and abdominal pain around the time that he stopped smoking without a nicotine aid.  Since then, his wife states that he has been very anxious and worried about eating.  He may eat 1 sandwich a day.  He was started on sertraline 4 days ago and is concerned that this may be causing  his  diarrhea.  His symptoms are impacting his home life as well as his new job that started a month ago.  There are also other recent stressors that may be exacerbating his symptoms.  He denies SI or HI.  I had a shared decision-making conversation with the patient.  I [facility] to have a local behavioral health provider who can manage his medications and symptoms.  However, given that his symptoms are worsening and impacting multiple areas of his life, he may benefit from inpatient admission for medication management.  He was agreeable with the latter option.  [facility], Gillermo MurdochJacqueline Thompson , who [facility].  COVID-19 test is pending.  The patient has no other symptoms, and I do not feel that he needs repeat labs at this time.  His symptoms have been managed in the ER with Imodium and Zofran.  Safe transport has been contacted.  EMTALA completed by Dr. Read DriversMolpus, attending physician.  At time of transfer, patient is hemodynamically stable and in no acute distress.  [facility].   Final Clinical Impression(s) / ED Diagnoses Final diagnoses:  Paranoid behavior (HCC)  Depression, unspecified depression type  Functional diarrhea    Rx / DC Orders ED Discharge Orders    None       Barkley BoardsMcDonald, Wilbert Schouten A, PA-C 10/21/20 0023    Molpus, John, MD 10/21/20 [phone]

## 2020-10-20 NOTE — ED Triage Notes (Signed)
Pt reports stomach cramping and diarrhea since starting Zoloft about 3 days ago. Nausea controlled with Zofran.

## 2020-10-21 ENCOUNTER — Encounter (HOSPITAL_COMMUNITY): Payer: Self-pay

## 2020-10-21 ENCOUNTER — Inpatient Hospital Stay (HOSPITAL_COMMUNITY)
Admission: AD | Admit: 2020-10-21 | Discharge: 2020-10-23 | DRG: 885 | Disposition: A | Discharge status: Home / Self Care | Payer: Federal, State, Local not specified - Other | Source: Intra-hospital | Attending: Psychiatry | Admitting: Psychiatry

## 2020-10-21 ENCOUNTER — Ambulatory Visit (HOSPITAL_COMMUNITY)
Admission: EM | Admit: 2020-10-21 | Discharge: 2020-10-21 | Disposition: A | Discharge status: Home / Self Care | Payer: No Payment, Other | Attending: Psychiatry | Admitting: Psychiatry

## 2020-10-21 ENCOUNTER — Encounter (HOSPITAL_COMMUNITY): Payer: Self-pay | Admitting: Psychiatry

## 2020-10-21 ENCOUNTER — Other Ambulatory Visit: Payer: Self-pay

## 2020-10-21 DIAGNOSIS — K219 Gastro-esophageal reflux disease without esophagitis: Secondary | ICD-10-CM | POA: Diagnosis present

## 2020-10-21 DIAGNOSIS — R110 Nausea: Secondary | ICD-10-CM | POA: Diagnosis not present

## 2020-10-21 DIAGNOSIS — F321 Major depressive disorder, single episode, moderate: Secondary | ICD-10-CM | POA: Diagnosis present

## 2020-10-21 DIAGNOSIS — F419 Anxiety disorder, unspecified: Secondary | ICD-10-CM | POA: Diagnosis present

## 2020-10-21 DIAGNOSIS — Z20822 Contact with and (suspected) exposure to covid-19: Secondary | ICD-10-CM | POA: Diagnosis present

## 2020-10-21 DIAGNOSIS — R109 Unspecified abdominal pain: Secondary | ICD-10-CM | POA: Insufficient documentation

## 2020-10-21 DIAGNOSIS — R45851 Suicidal ideations: Secondary | ICD-10-CM | POA: Diagnosis present

## 2020-10-21 DIAGNOSIS — Z87891 Personal history of nicotine dependence: Secondary | ICD-10-CM

## 2020-10-21 DIAGNOSIS — R197 Diarrhea, unspecified: Secondary | ICD-10-CM | POA: Insufficient documentation

## 2020-10-21 DIAGNOSIS — F32A Depression, unspecified: Secondary | ICD-10-CM

## 2020-10-21 DIAGNOSIS — G4700 Insomnia, unspecified: Secondary | ICD-10-CM | POA: Diagnosis present

## 2020-10-21 DIAGNOSIS — Z79899 Other long term (current) drug therapy: Secondary | ICD-10-CM | POA: Insufficient documentation

## 2020-10-21 DIAGNOSIS — F411 Generalized anxiety disorder: Secondary | ICD-10-CM | POA: Diagnosis not present

## 2020-10-21 DIAGNOSIS — F333 Major depressive disorder, recurrent, severe with psychotic symptoms: Secondary | ICD-10-CM | POA: Insufficient documentation

## 2020-10-21 DIAGNOSIS — F22 Delusional disorders: Secondary | ICD-10-CM

## 2020-10-21 LAB — RESPIRATORY PANEL BY RT PCR (FLU A&B, COVID)
Influenza A by PCR: NEGATIVE
Influenza B by PCR: NEGATIVE
SARS Coronavirus 2 by RT PCR: NEGATIVE

## 2020-10-21 LAB — RAPID URINE DRUG SCREEN, HOSP PERFORMED
Amphetamines: NOT DETECTED
Barbiturates: NOT DETECTED
Benzodiazepines: NOT DETECTED
Cocaine: NOT DETECTED
Opiates: NOT DETECTED
Tetrahydrocannabinol: NOT DETECTED

## 2020-10-21 MED ORDER — ALUM & MAG HYDROXIDE-SIMETH 200-200-20 MG/5ML PO SUSP: 30.0000 mL | ORAL | Status: DC | PRN

## 2020-10-21 MED ORDER — HYDROXYZINE HCL 25 MG PO TABS: 25.0000 mg | ORAL_TABLET | Freq: Four times a day (QID) | ORAL | Status: DC | PRN

## 2020-10-21 MED ORDER — MAGNESIUM HYDROXIDE 400 MG/5ML PO SUSP: 30.0000 mL | Freq: Every day | ORAL | Status: DC | PRN

## 2020-10-21 MED ORDER — MIRTAZAPINE 15 MG PO TABS: 15.0000 mg | ORAL_TABLET | Freq: Every day | ORAL | Status: DC

## 2020-10-21 MED ORDER — TRAZODONE HCL 50 MG PO TABS
50.0000 mg | ORAL_TABLET | Freq: Every evening | ORAL | Status: DC | PRN
  Administered 2020-10-21: 50 mg via ORAL
  Filled 2020-10-21: qty 1

## 2020-10-21 MED ORDER — BISMUTH SUBSALICYLATE 262 MG/15ML PO SUSP
15.0000 mL | Freq: Four times a day (QID) | ORAL | Status: DC | PRN
  Filled 2020-10-21: qty 118

## 2020-10-21 MED ORDER — ONDANSETRON 4 MG PO TBDP: 4.0000 mg | ORAL_TABLET | Freq: Three times a day (TID) | ORAL | Status: DC | PRN

## 2020-10-21 MED ORDER — HYDROXYZINE HCL 25 MG PO TABS
25.0000 mg | ORAL_TABLET | Freq: Three times a day (TID) | ORAL | Status: DC | PRN
  Administered 2020-10-21: 25 mg via ORAL
  Filled 2020-10-21: qty 1
  Filled 2020-10-21: qty 10

## 2020-10-21 MED ORDER — ACETAMINOPHEN 325 MG PO TABS: 650.0000 mg | ORAL_TABLET | Freq: Four times a day (QID) | ORAL | Status: DC | PRN

## 2020-10-21 MED ORDER — ONDANSETRON 4 MG PO TBDP
4.0000 mg | ORAL_TABLET | Freq: Three times a day (TID) | ORAL | Status: DC | PRN
  Administered 2020-10-21: 4 mg via ORAL
  Filled 2020-10-21: qty 1

## 2020-10-21 MED ORDER — MIRTAZAPINE 15 MG PO TABS
15.0000 mg | ORAL_TABLET | Freq: Every day | ORAL | Status: DC
  Administered 2020-10-21 – 2020-10-22 (×2): 15 mg via ORAL
  Filled 2020-10-21 (×3): qty 1
  Filled 2020-10-21: qty 7
  Filled 2020-10-21: qty 1

## 2020-10-21 MED ORDER — BISMUTH SUBSALICYLATE 262 MG PO CHEW: 262.0000 mg | CHEWABLE_TABLET | Freq: Four times a day (QID) | ORAL | Status: DC | PRN

## 2020-10-21 MED ORDER — LORAZEPAM 1 MG PO TABS
1.0000 mg | ORAL_TABLET | Freq: Once | ORAL | Status: AC
  Administered 2020-10-21: 1 mg via ORAL
  Filled 2020-10-21: qty 1

## 2020-10-21 NOTE — ED Provider Notes (Signed)
[facility]/OBS ASAP Discharge Summary  Date and Time: 10/21/2020 11:10 AM  Name: Jeremiah Roberts  MRN:  [id number]   Discharge Diagnoses:  Final diagnoses:  None    Subjective:   Patient interviewed bedside. He is intermittently tearful and rocking back in forth in bed.. He states that since he quit smoking 09/18/2020 he has been experiencing increased anxiety, low mood, loss of appetite and paranoia. He states that the night prior to quitting cigarettes "cold [place]" he had been sick, nauseas and was throwing up. Since this time he states that he has been very paranoid about what he eats and has been comfortable only eating sandwiches. He states that his anxiety and food related paranoia has led to a 30-35 lbs weight loss over the last month. He states that the paranoia mostly stems from anxiety which has been disabling to the point where in addition to ~35 lb weight loss over a month, he has missed several days from work. In regards to paranoia, he denies feeling as tough he is being watched/followed; denies feeling that there are listening devices in his home. Pt states that he used a telehealth app ([employer]) and was prescribed zoloft 4 days ago. He states that since this time he has had diarrhea, dry heaving, worsening nausea which prompted ER visit. He admits to depressed mood, , anhedonia, hopelessness, concentration difficulties, and hopelessness. Pt states that he recently started a new job and has missed several days d/t feeling unwell and that he and his wife are about to lose their housing; they had been living with his parents and were recently asked to leave. Denies active SI. When asked about passive SI he becomes tearful and states thath is wife is the only positive thing in his life and the only way he has "coped so far". Denies HI/AVH. Marland Kitchen Pt tearful throughout interview and rocking back and forth appearing extremely anxious throughout; expresses concern about his functionality declining d/t severe  anxiety and low mood.  Almira Coaster (wife) [phone] Called at 11:12 AM Worsening anxiety and depressed. Worse over the past month and just cant seem to get breaks even with taking medications, stomach has been bothering him a lot. He has had some bad diarrhea, has been dry heaving. Not eating as he normally would. Prior to starting latest prescription had to leave work early. She can tell that his anxiety has gitten worse by the way he acts. He used to like playing computer games but no longer enjoys it. He has not mentioned been depressed but can tell he is; also has been making passively suicidal comments.  Lives with parents and they constantly threaten to throw them out. He has lost about 30 lbs d/t anxiety and paranoia over the last month. He has made passive suicidal comments, but never made actively suicidal comments. Thinks he would benefit from inpatient treatment for anxiety d/t decline in function   Stay Summary:  Patient is 29 yo male with a h/o anxiety who  presented 10/31 to [facility] for watery diarrhea over the last four days. The patient has a history of depression and anxiety who presents with three episodes of watery diarrhea during the previous four days with intermittent nausea and abdominal cramping.  He was advised to come to the ER by a nursing triage line when he called earlier tonight.  The patient was transferred to [facility] from [facility] to be further evaluated for his mental problems. The patient had begun taking Zoloft to treat his depression and his  anxiety. He disclosed that the medications have been affecting his stomach since he started on the medication.  Patient reported having paranoia for the last month regarding food which has led to ~35 lb weight loss. Pt reports that his anxiety has gotten to the point where it is disabling and unable to function.   Total Time spent with patient: 45 minutes  Past Psychiatric History: see H&P Past Medical History:  Past Medical History:   Diagnosis Date  . Acid reflux    History reviewed. No pertinent surgical history. Family History: History reviewed. No pertinent family history. Family Psychiatric History: see H&P Social History:  Social History   Substance and Sexual Activity  Alcohol Use Yes   Comment: occasionally     Social History   Substance and Sexual Activity  Drug Use Never    Social History   Socioeconomic History  . Marital status: Married    Spouse name: Not on file  . Number of children: Not on file  . Years of education: Not on file  . Highest education level: Not on file  Occupational History  . Not on file  Tobacco Use  . Smoking status: Former Smoker    Types: Cigars  . Smokeless tobacco: Never Used  . Tobacco comment: repotrs quit cold [place] beginning of Oct 2021  Vaping Use  . Vaping Use: Never used  Substance and Sexual Activity  . Alcohol use: Yes    Comment: occasionally  . Drug use: Never  . Sexual activity: Not on file  Other Topics Concern  . Not on file  Social History Narrative  . Not on file   Social Determinants of Health   Financial Resource Strain:   . Difficulty of Paying Living Expenses: Not on file  Food Insecurity:   . Worried About Programme researcher, broadcasting/film/video in the Last Year: Not on file  . Ran Out of Food in the Last Year: Not on file  Transportation Needs:   . Lack of Transportation (Medical): Not on file  . Lack of Transportation (Non-Medical): Not on file  Physical Activity:   . Days of Exercise per Week: Not on file  . Minutes of Exercise per Session: Not on file  Stress:   . Feeling of Stress : Not on file  Social Connections:   . Frequency of Communication with Friends and Family: Not on file  . Frequency of Social Gatherings with Friends and Family: Not on file  . Attends Religious Services: Not on file  . Active Member of Clubs or Organizations: Not on file  . Attends Banker Meetings: Not on file  . Marital Status: Not on file    SDOH:  SDOH Screenings   Alcohol Screen:   . Last Alcohol Screening Score (AUDIT): Not on file  Depression (PHQ2-9):   . PHQ-2 Score: Not on file  Financial Resource Strain:   . Difficulty of Paying Living Expenses: Not on file  Food Insecurity:   . Worried About Programme researcher, broadcasting/film/video in the Last Year: Not on file  . Ran Out of Food in the Last Year: Not on file  Housing:   . Last Housing Risk Score: Not on file  Physical Activity:   . Days of Exercise per Week: Not on file  . Minutes of Exercise per Session: Not on file  Social Connections:   . Frequency of Communication with Friends and Family: Not on file  . Frequency of Social Gatherings with Friends and Family:  Not on file  . Attends Religious Services: Not on file  . Active Member of Clubs or Organizations: Not on file  . Attends Banker Meetings: Not on file  . Marital Status: Not on file  Stress:   . Feeling of Stress : Not on file  Tobacco Use: Medium Risk  . Smoking Tobacco Use: Former Smoker  . Smokeless Tobacco Use: Never Used  Transportation Needs:   . Freight forwarder (Medical): Not on file  . Lack of Transportation (Non-Medical): Not on file    Has this patient used any form of tobacco in the last 30 days? (Cigarettes, Smokeless Tobacco, Cigars, and/or Pipes) Prescription not provided because: n/a  Current Medications:  Current Facility-Administered Medications  Medication Dose Route Frequency Provider Last Rate Last Admin  . acetaminophen (TYLENOL) tablet 650 mg  650 mg Oral Q6H PRN Gillermo Murdoch, NP      . alum & mag hydroxide-simeth (MAALOX/MYLANTA) 200-200-20 MG/5ML suspension 30 mL  30 mL Oral Q4H PRN Gillermo Murdoch, NP      . bismuth subsalicylate (PEPTO BISMOL) chewable tablet 262 mg  262 mg Oral Q6H PRN Gillermo Murdoch, NP      . hydrOXYzine (ATARAX/VISTARIL) tablet 25 mg  25 mg Oral Q6H PRN Gillermo Murdoch, NP      . magnesium hydroxide (MILK OF MAGNESIA)  suspension 30 mL  30 mL Oral Daily PRN Gillermo Murdoch, NP      . ondansetron (ZOFRAN-ODT) disintegrating tablet 4 mg  4 mg Oral Q8H PRN Gillermo Murdoch, NP       Current Outpatient Medications  Medication Sig Dispense Refill  . alum & mag hydroxide-simeth (MAALOX MAX) 400-400-40 MG/5ML suspension Take 10 mLs by mouth every 6 (six) hours as needed for indigestion. 355 mL 0  . bismuth subsalicylate (PEPTO BISMOL) 262 MG/15ML suspension Take 15 mLs by mouth every 6 (six) hours as needed for indigestion.    . dimenhyDRINATE (DRAMAMINE PO) Take 2 tablets by mouth daily as needed (nausea).    . hydrOXYzine (ATARAX/VISTARIL) 25 MG tablet Take 1 tablet (25 mg total) by mouth every 6 (six) hours as needed for anxiety. 12 tablet 0  . ibuprofen (ADVIL) 200 MG tablet Take 200 mg by mouth every 6 (six) hours as needed for moderate pain.    Marland Kitchen omeprazole (PRILOSEC) 20 MG capsule Take 20 mg by mouth in the morning.    . ondansetron (ZOFRAN ODT) 4 MG disintegrating tablet Take 1 tablet (4 mg total) by mouth every 8 (eight) hours as needed for nausea or vomiting. 20 tablet 0    PTA Medications: (Not in a hospital admission)   Musculoskeletal  Strength & Muscle Tone: within normal limits Gait & Station: normal Patient leans: N/A  Psychiatric Specialty Exam  Presentation  General Appearance: Appropriate for Environment  Eye Contact:Good  Speech:Clear and Coherent  Speech Volume:Normal  Handedness:Right   Mood and Affect  Mood:Anxious;Depressed  Affect:Blunt;Congruent;Depressed   Thought Process  Thought Processes:Coherent  Descriptions of Associations:Intact  Orientation:Full (Time, Place and Person)  Thought Content:Logical;WDL  Hallucinations:Hallucinations: None  Ideas of Reference:None  Suicidal Thoughts:Suicidal Thoughts: No  Homicidal Thoughts:Homicidal Thoughts: No   Sensorium  Memory:Immediate Good;Recent Good;Remote  Good  Judgment:Good  Insight:Good   Executive Functions  Concentration:Good  Attention Span:Good  Recall:Good  Fund of Knowledge:Good  Language:Good   Psychomotor Activity  Psychomotor Activity:Psychomotor Activity: Normal   Assets  Assets:Communication Skills;Desire for Improvement;Financial Resources/Insurance;Housing;Physical Health;Leisure Time;Social Support   Sleep  Sleep:Sleep: Good   Physical  Exam  Physical Exam ROS Blood pressure 138/86, pulse 81, temperature 97.8 F (36.6 C), temperature source Oral, resp. rate 18, height 5' 9.29" (1.76 m), weight 83.6 kg, SpO2 99 %. Body mass index is 27 kg/m.  Demographic Factors:  Male, Divorced or widowed, Caucasian and Low socioeconomic status  Loss Factors: Decline in physical health and Financial problems/change in socioeconomic status  Historical Factors: NA  Risk Reduction Factors:   Sense of responsibility to family and Living with another person, especially a relative  Continued Clinical Symptoms:  Severe Anxiety and/or Agitation Depression:   Anhedonia Hopelessness Previous Psychiatric Diagnoses and Treatments  Cognitive Features That Contribute To Risk:  Thought constriction (tunnel vision)    Suicide Risk:  Mild:  Suicidal ideation of limited frequency, intensity, duration, and specificity.  There are no identifiable plans, no associated intent, mild dysphoria and related symptoms, good self-control (both objective and subjective assessment), few other risk factors, and identifiable protective factors, including available and accessible social support.  Plan Of Care/Follow-up recommendations:  Activity:  as tolerated Diet:  regular Other:   [facility]   MDD Anxiety R/o MDD with psychotic features  29 yo male with anxiety who presented to ER with nausea, vomiting and paranoia for the last month. Pt meets criteria for MDD. He reports paranoia r/t food for the past month that has led to  ~35 lb weight loss. Pt is extremely anxious appearing, rocking back and forth in bed and tearful Based on assessment in ER setting does not necessarily seem to be psychotic in nature; this can be further explored upon admission; however, it is disabling to the point where it has impacted his ability to function. Wife raises concern for comments re: (passive) SI he has has made and does not believe him to be himself and some safety concerns. Patient would benefit from admission for safety and stabilization.Will start remeron 15 mg qhs as it may help with appetite-also has anti emetic properties to help with nausea. Will defer initiation of antipsychotic at this time as unclear if this is truly indicated- will defer to inpatient team.   [facility] and [facility]  Estella HuskKatherine S Leydy Worthey, MD 10/21/2020, 11:10 AM

## 2020-10-21 NOTE — ED Notes (Signed)
PATIENT BELONGINGS:  PERSONAL & MISCELLANEOUS SUPPLIES  ARE STORED IN [facility] 09

## 2020-10-21 NOTE — ED Triage Notes (Signed)
Pt arrives from [facility]. States 'I want help with my anxiety, it has put me through hell for the last month'. Pt reports quitting smoking a month ago cold [place]. Pt denies poor appetite and 30-40# weight loss. Pt denies SI/HI/AVH. Pt calm & cooperative, alert & oriented x4, in no acute distress at this time.

## 2020-10-21 NOTE — Progress Notes (Signed)
Jeremiah Roberts stated he is feeling anxious and tired this morning. He denied feeling suicidal. His affect is flat. He is resting in his chair bed.

## 2020-10-21 NOTE — ED Notes (Signed)
Pt resting on pull out with eyes closed unlabored respirations through night in no acute distress. Safety maintained.  

## 2020-10-21 NOTE — Discharge Instructions (Addendum)
Upon discharge from behavioral health hospital, please continue medication as directed and please attend follow ups.

## 2020-10-21 NOTE — Progress Notes (Signed)
Report was called to nurse Lanora Manis at [facility], [facility] was notified and stated his pick-up ETA is one hour. The patient was made aware of the situation.

## 2020-10-21 NOTE — ED Notes (Signed)
Pt offered breakfast but declined

## 2020-10-21 NOTE — ED Provider Notes (Signed)
Behavioral Health Admission [facility]&P [facility] & [facility]BS)  Date: 10/21/20 Patient Name: Jeremiah Roberts MRN: [id number] Chief Complaint:  Chief Complaint  Patient presents with  . Anxiety      Diagnoses: Major Depression disorder, severe Generalized anxiety disorder  HPI: Jeremiah Roberts is a 29 year old male seen at [facility] for watery diarrhea over the last four days. The patient has a history of depression and anxiety who presents with three episodes of watery diarrhea during the previous four days with intermittent nausea and abdominal cramping.  He was advised to come to the ER by a nursing triage line when he called earlier tonight.  The patient was transferred to [facility] from [facility] to be further evaluated for his mental problems. The patient had begun taking Zoloft to treat his depression and his anxiety. He disclosed that the medications have been affecting his stomach since he started on the medication.   The patient does not appear to be responding to internal or external stimuli. Neither is the patient presenting with any delusional thinking. The patient denies auditory or visual hallucinations. The patient denies any suicidal, homicidal, or self-harm ideations. The patient is not presenting with any psychotic or paranoid behaviors. During an encounter with the patient, he was able to answer questions appropriately. The patient is casually dressed, alert, and oriented x4. The patient speaks in a clear tone, at moderate volume, and at an average pace. Motor behavior appears normal. Eye contact is good. The patient's mood is depressed, and his affect is congruent with his mood. The thought process is coherent and relevant. There is no indication the patient is currently responding to internal stimuli or experiencing delusional thought content. The patient was cooperative throughout the assessment. He is requesting inpatient treatment for mental health and substance use.  PHQ 2-9:     Total Time spent with  patient: 20 minutes  Musculoskeletal  Strength & Muscle Tone: within normal limits Gait & Station: normal Patient leans: N/A  Psychiatric Specialty Exam  Presentation General Appearance: Appropriate for Environment  Eye Contact:Good  Speech:Clear and Coherent  Speech Volume:Normal  Handedness:Right   Mood and Affect  Mood:Anxious;Depressed  Affect:Blunt;Congruent;Depressed   Thought Process  Thought Processes:Coherent  Descriptions of Associations:Intact  Orientation:Full (Time, Place and Person)  Thought Content:Logical;WDL  Hallucinations:Hallucinations: None  Ideas of Reference:None  Suicidal Thoughts:Suicidal Thoughts: No  Homicidal Thoughts:Homicidal Thoughts: No   Sensorium  Memory:Immediate Good;Recent Good;Remote Good  Judgment:Good  Insight:Good   Executive Functions  Concentration:Good  Attention Span:Good  Recall:Good  Fund of Knowledge:Good  Language:Good   Psychomotor Activity  Psychomotor Activity:Psychomotor Activity: Normal   Assets  Assets:Communication Skills;Desire for Improvement;Financial Resources/Insurance;Housing;Physical Health;Leisure Time;Social Support   Sleep  Sleep:Sleep: Good   Physical Exam Vitals and nursing note reviewed.  Constitutional:      Appearance: Normal appearance. He is obese.  Cardiovascular:     Rate and Rhythm: Normal rate.     Pulses: Normal pulses.  Pulmonary:     Effort: Pulmonary effort is normal.  Abdominal:     General: Abdomen is flat.  Musculoskeletal:        General: Normal range of motion.     Cervical back: Normal range of motion and neck supple.  Neurological:     General: No focal deficit present.     Mental Status: He is alert and oriented to person, place, and time. Mental status is at baseline.  Psychiatric:        Attention and Perception: Attention and perception normal.  Mood and Affect: Mood is anxious and depressed.        Speech: Speech normal.         Behavior: Behavior normal. Behavior is cooperative.        Thought Content: Thought content normal.    Review of Systems  Psychiatric/Behavioral: Positive for depression. The patient is nervous/anxious.   All other systems reviewed and are negative.   Blood pressure 106/78, pulse 60, temperature 97.7 F (36.5 C), temperature source Tympanic, resp. rate 18, height 5' 9.29" (1.76 m), weight 184 lb 6.4 oz (83.6 kg), SpO2 99 %. Body mass index is 27 kg/m.  Past Psychiatric History:    Is the patient at risk to self? No  Has the patient been a risk to self in the past 6 months? No .    Has the patient been a risk to self within the distant past? No   Is the patient a risk to others? No   Has the patient been a risk to others in the past 6 months? No   Has the patient been a risk to others within the distant past? No   Past Medical History:  Past Medical History:  Diagnosis Date  . Acid reflux    History reviewed. No pertinent surgical history.  Family History: History reviewed. No pertinent family history.  Social History:  Social History   Socioeconomic History  . Marital status: Married    Spouse name: Not on file  . Number of children: Not on file  . Years of education: Not on file  . Highest education level: Not on file  Occupational History  . Not on file  Tobacco Use  . Smoking status: Former Smoker    Types: Cigars  . Smokeless tobacco: Never Used  . Tobacco comment: repotrs quit cold [place] beginning of Oct 2021  Vaping Use  . Vaping Use: Never used  Substance and Sexual Activity  . Alcohol use: Yes    Comment: occasionally  . Drug use: Never  . Sexual activity: Not on file  Other Topics Concern  . Not on file  Social History Narrative  . Not on file   Social Determinants of Health   Financial Resource Strain:   . Difficulty of Paying Living Expenses: Not on file  Food Insecurity:   . Worried About Programme researcher, broadcasting/film/video in the Last Year: Not on  file  . Ran Out of Food in the Last Year: Not on file  Transportation Needs:   . Lack of Transportation (Medical): Not on file  . Lack of Transportation (Non-Medical): Not on file  Physical Activity:   . Days of Exercise per Week: Not on file  . Minutes of Exercise per Session: Not on file  Stress:   . Feeling of Stress : Not on file  Social Connections:   . Frequency of Communication with Friends and Family: Not on file  . Frequency of Social Gatherings with Friends and Family: Not on file  . Attends Religious Services: Not on file  . Active Member of Clubs or Organizations: Not on file  . Attends Banker Meetings: Not on file  . Marital Status: Not on file  Intimate Partner Violence:   . Fear of Current or Ex-Partner: Not on file  . Emotionally Abused: Not on file  . Physically Abused: Not on file  . Sexually Abused: Not on file    SDOH:  SDOH Screenings   Alcohol Screen:   . Last Alcohol  Screening Score (AUDIT): Not on file  Depression (PHQ2-9):   . PHQ-2 Score: Not on file  Financial Resource Strain:   . Difficulty of Paying Living Expenses: Not on file  Food Insecurity:   . Worried About Programme researcher, broadcasting/film/video in the Last Year: Not on file  . Ran Out of Food in the Last Year: Not on file  Housing:   . Last Housing Risk Score: Not on file  Physical Activity:   . Days of Exercise per Week: Not on file  . Minutes of Exercise per Session: Not on file  Social Connections:   . Frequency of Communication with Friends and Family: Not on file  . Frequency of Social Gatherings with Friends and Family: Not on file  . Attends Religious Services: Not on file  . Active Member of Clubs or Organizations: Not on file  . Attends Banker Meetings: Not on file  . Marital Status: Not on file  Stress:   . Feeling of Stress : Not on file  Tobacco Use: Medium Risk  . Smoking Tobacco Use: Former Smoker  . Smokeless Tobacco Use: Never Used  Transportation Needs:    . Freight forwarder (Medical): Not on file  . Lack of Transportation (Non-Medical): Not on file    Last Labs:  Admission on 10/20/2020, Discharged on 10/21/2020  Component Date Value Ref Range Status  . SARS Coronavirus 2 by RT PCR 10/20/2020 NEGATIVE  NEGATIVE Final   Comment: (NOTE) SARS-CoV-2 target nucleic acids are NOT DETECTED.  The SARS-CoV-2 RNA is generally detectable in upper respiratoy specimens during the acute phase of infection. The lowest concentration of SARS-CoV-2 viral copies this assay can detect is 131 copies/mL. A negative result does not preclude SARS-Cov-2 infection and should not be used as the sole basis for treatment or other patient management decisions. A negative result may occur with  improper specimen collection/handling, submission of specimen other than nasopharyngeal swab, presence of viral mutation(s) within the areas targeted by this assay, and inadequate number of viral copies (<131 copies/mL). A negative result must be combined with clinical observations, patient history, and epidemiological information. The expected result is Negative.  Fact Sheet for Patients:  https://www.moore.com/  Fact Sheet for Healthcare Providers:  https://www.young.biz/  This test is no                          t yet approved or cleared by the [place] FDA and  has been authorized for detection and/or diagnosis of SARS-CoV-2 by FDA under an Emergency Use Authorization (EUA). This EUA will remain  in effect (meaning this test can be used) for the duration of the COVID-19 declaration under Section 564(b)(1) of the Act, 21 U.S.C. section 360bbb-3(b)(1), unless the authorization is terminated or revoked sooner.    . Influenza A by PCR 10/20/2020 NEGATIVE  NEGATIVE Final  . Influenza B by PCR 10/20/2020 NEGATIVE  NEGATIVE Final   Comment: (NOTE) The Xpert Xpress SARS-CoV-2/FLU/RSV assay is intended as an aid in   the diagnosis of influenza from Nasopharyngeal swab specimens and  should not be used as a sole basis for treatment. Nasal washings and  aspirates are unacceptable for Xpert Xpress SARS-CoV-2/FLU/RSV  testing.  Fact Sheet for Patients: https://www.moore.com/  Fact Sheet for Healthcare Providers: https://www.young.biz/  This test is not yet approved or cleared by the [place] FDA and  has been authorized for detection and/or diagnosis of SARS-CoV-2 by  FDA under  an Emergency Use Authorization (EUA). This EUA will remain  in effect (meaning this test can be used) for the duration of the  Covid-19 declaration under Section 564(b)(1) of the Act, 21  U.S.C. section 360bbb-3(b)(1), unless the authorization is  terminated or revoked. Performed at [facility], [address], [place], [place] [address]   . Opiates 10/20/2020 NONE DETECTED  NONE DETECTED Final  . Cocaine 10/20/2020 NONE DETECTED  NONE DETECTED Final  . Benzodiazepines 10/20/2020 NONE DETECTED  NONE DETECTED Final  . Amphetamines 10/20/2020 NONE DETECTED  NONE DETECTED Final  . Tetrahydrocannabinol 10/20/2020 NONE DETECTED  NONE DETECTED Final  . Barbiturates 10/20/2020 NONE DETECTED  NONE DETECTED Final   Comment: (NOTE) DRUG SCREEN FOR MEDICAL PURPOSES ONLY.  IF CONFIRMATION IS NEEDED FOR ANY PURPOSE, NOTIFY LAB WITHIN 5 DAYS.  LOWEST DETECTABLE LIMITS FOR URINE DRUG SCREEN Drug Class                     Cutoff (ng/mL) Amphetamine and metabolites    1000 Barbiturate and metabolites    200 Benzodiazepine                 200 Tricyclics and metabolites     300 Opiates and metabolites        300 Cocaine and metabolites        300 THC                            50 Performed at [facility], [address], [place], [place] [address]   . Color, Urine 10/20/2020 YELLOW  YELLOW Final  . APPearance 10/20/2020 CLEAR  CLEAR Final  .  Specific Gravity, Urine 10/20/2020 1.008  1.005 - 1.030 Final  . pH 10/20/2020 6.0  5.0 - 8.0 Final  . Glucose, UA 10/20/2020 NEGATIVE  NEGATIVE mg/dL Final  . Hgb urine dipstick 10/20/2020 SMALL* NEGATIVE Final  . Bilirubin Urine 10/20/2020 NEGATIVE  NEGATIVE Final  . Ketones, ur 10/20/2020 NEGATIVE  NEGATIVE mg/dL Final  . Protein, ur 29/56/2130 NEGATIVE  NEGATIVE mg/dL Final  . Nitrite 86/57/8469 NEGATIVE  NEGATIVE Final  . Glori Luis 10/20/2020 NEGATIVE  NEGATIVE Final  . RBC / HPF 10/20/2020 0-5  0 - 5 RBC/hpf Final  . WBC, UA 10/20/2020 0-5  0 - 5 WBC/hpf Final  . Bacteria, UA 10/20/2020 NONE SEEN  NONE SEEN Final  . Mucus 10/20/2020 PRESENT   Final   Performed at [facility], [address], [place], [place] [address]  Admission on 10/14/2020, Discharged on 10/14/2020  Component Date Value Ref Range Status  . Sodium 10/14/2020 139  135 - 145 mmol/L Final  . Potassium 10/14/2020 3.8  3.5 - 5.1 mmol/L Final  . Chloride 10/14/2020 103  98 - 111 mmol/L Final  . CO2 10/14/2020 27  22 - 32 mmol/L Final  . Glucose, Bld 10/14/2020 97  70 - 99 mg/dL Final   Glucose reference range applies only to samples taken after fasting for at least 8 hours.  . BUN 10/14/2020 13  6 - 20 mg/dL Final  . Creatinine, Ser 10/14/2020 0.97  0.61 - 1.24 mg/dL Final  . Calcium 84/13/2440 9.2  8.9 - 10.3 mg/dL Final  . Total Protein 10/14/2020 7.3  6.5 - 8.1 g/dL Final  . Albumin 10/17/2535 4.9  3.5 - 5.0 g/dL Final  . AST 64/40/3474 20  15 - 41 U/L Final  . ALT  10/14/2020 18  0 - 44 U/L Final  . Alkaline Phosphatase 10/14/2020 80  38 - 126 U/L Final  . Total Bilirubin 10/14/2020 1.1  0.3 - 1.2 mg/dL Final  . GFR, Estimated 10/14/2020 >60  >60 mL/min Final   Comment: (NOTE) Calculated using the CKD-EPI Creatinine Equation (2021)   . Anion gap 10/14/2020 9  5 - 15 Final   Performed at [facility], [address] [place], [place] [address]  . Lipase 10/14/2020 30   11 - 51 U/L Final   Performed at [facility], [address] [place], [place] [address]  . WBC 10/14/2020 7.2  4.0 - 10.5 K/uL Final  . RBC 10/14/2020 4.80  4.22 - 5.81 MIL/uL Final  . Hemoglobin 10/14/2020 15.0  13.0 - 17.0 g/dL Final  . HCT 09/81/191410/25/2021 44.0  39 - 52 % Final  . MCV 10/14/2020 91.7  80.0 - 100.0 fL Final  . MCH 10/14/2020 31.3  26.0 - 34.0 pg Final  . MCHC 10/14/2020 34.1  30.0 - 36.0 g/dL Final  . RDW 78/29/562110/25/2021 11.3* 11.5 - 15.5 % Final  . Platelets 10/14/2020 209  150 - 400 K/uL Final  . nRBC 10/14/2020 0.0  0.0 - 0.2 % Final  . Neutrophils Relative % 10/14/2020 58  % Final  . Neutro Abs 10/14/2020 4.1  1.7 - 7.7 K/uL Final  . Lymphocytes Relative 10/14/2020 26  % Final  . Lymphs Abs 10/14/2020 1.8  0.7 - 4.0 K/uL Final  . Monocytes Relative 10/14/2020 13  % Final  . Monocytes Absolute 10/14/2020 1.0  0.1 - 1.0 K/uL Final  . Eosinophils Relative 10/14/2020 3  % Final  . Eosinophils Absolute 10/14/2020 0.2  0.0 - 0.5 K/uL Final  . Basophils Relative 10/14/2020 0  % Final  . Basophils Absolute 10/14/2020 0.0  0.0 - 0.1 K/uL Final  . Immature Granulocytes 10/14/2020 0  % Final  . Abs Immature Granulocytes 10/14/2020 0.03  0.00 - 0.07 K/uL Final   Performed at [facility], [address] [place], [place] [address]  . Troponin I (High Sensitivity) 10/14/2020 4  <18 ng/L Final   Comment: (NOTE) Elevated high sensitivity troponin I (hsTnI) values and significant  changes across serial measurements may suggest ACS but many other  chronic and acute conditions are known to elevate hsTnI results.  Refer to the "Links" section for chest pain algorithms and additional  guidance. Performed at [facility], [address] [place], [place] [address]   . Opiates 10/14/2020 NONE DETECTED  NONE DETECTED Final  . Cocaine 10/14/2020 NONE DETECTED  NONE DETECTED Final  . Benzodiazepines 10/14/2020 NONE DETECTED  NONE  DETECTED Final  . Amphetamines 10/14/2020 NONE DETECTED  NONE DETECTED Final  . Tetrahydrocannabinol 10/14/2020 NONE DETECTED  NONE DETECTED Final  . Barbiturates 10/14/2020 NONE DETECTED  NONE DETECTED Final   Comment: (NOTE) DRUG SCREEN FOR MEDICAL PURPOSES ONLY.  IF CONFIRMATION IS NEEDED FOR ANY PURPOSE, NOTIFY LAB WITHIN 5 DAYS.  LOWEST DETECTABLE LIMITS FOR URINE DRUG SCREEN Drug Class                     Cutoff (ng/mL) Amphetamine and metabolites    1000 Barbiturate and metabolites    200 Benzodiazepine                 200 Tricyclics and metabolites     300 Opiates and metabolites  300 Cocaine and metabolites        300 THC                            50 Performed at [facility], [address] [place], [place] [address]     Allergies: Patient has no known allergies.  PTA Medications: (Not in a hospital admission)   Medical Decision Making   Recommendations  Based on my evaluation the patient does not appear to have an emergency medical condition.  Gillermo Murdoch, NP 10/21/20  1:53 AM
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
## 2020-10-21 NOTE — Progress Notes (Signed)
[facility] arrived to take Jeremiah Roberts to [facility] at 1925 hrs with his personal belongings.

## 2020-10-21 NOTE — ED Notes (Signed)
Pt currently talking with provider.

## 2020-10-21 NOTE — ED Notes (Signed)
Pt wanded and [facility] contacted for transport

## 2020-10-21 NOTE — Tx Team (Signed)
Initial Treatment Plan 10/21/2020 9:56 PM [name] BZJ:[id number]    PATIENT STRESSORS: Financial difficulties Loss of housing Marital or family conflict Medication change or noncompliance   PATIENT STRENGTHS: Capable of independent living Motivation for treatment/growth   PATIENT IDENTIFIED PROBLEMS: Major Depressive Disorder  Anxiety  Stressors are he and wife loosing housing/financial                 DISCHARGE CRITERIA:  Improved stabilization in mood, thinking, and/or behavior  PRELIMINARY DISCHARGE PLAN: Outpatient therapy  PATIENT/FAMILY INVOLVEMENT: This treatment plan has been presented to and reviewed with the patient, Jeremiah Roberts, and/or family member, .  The patient and family have been given the opportunity to ask questions and make suggestions.  Shelia Media, RN 10/21/2020, 9:56 PM

## 2020-10-21 NOTE — ED Notes (Signed)
Pt sleeping@this time. breathign even and unlabored. Will continue to monitor for safety 

## 2020-10-22 DIAGNOSIS — F321 Major depressive disorder, single episode, moderate: Principal | ICD-10-CM

## 2020-10-22 LAB — TSH: TSH: 1.27 u[IU]/mL (ref 0.350–4.500)

## 2020-10-22 MED ORDER — FAMOTIDINE 40 MG PO TABS
40.0000 mg | ORAL_TABLET | Freq: Two times a day (BID) | ORAL | Status: DC
  Filled 2020-10-22 (×3): qty 1

## 2020-10-22 MED ORDER — FAMOTIDINE 20 MG PO TABS
40.0000 mg | ORAL_TABLET | Freq: Two times a day (BID) | ORAL | Status: DC
  Administered 2020-10-22 – 2020-10-23 (×2): 40 mg via ORAL
  Filled 2020-10-22 (×4): qty 2
  Filled 2020-10-22 (×2): qty 28
  Filled 2020-10-22: qty 2

## 2020-10-22 NOTE — H&P (Signed)
Psychiatric Admission Assessment Adult  Patient Identification: Jeremiah MareDavid Roberts MRN:  [id number] Date of Evaluation:  10/22/2020 Chief Complaint:  MDD (major depressive disorder), single episode, moderate (HCC) [F32.1] Principal Diagnosis: <principal problem not specified> Diagnosis:  Active Problems:   MDD (major depressive disorder), single episode, moderate (HCC)  History of Present Illness: Patient is seen and examined.  Patient is a 29 year old male with a past psychiatric history significant for new onset anxiety and depression after attempting to quit smoking who originally presented to the [facility] emergency department on 10/14/2020.  He was complaining of "paranoia" as well as abdominal pain and diarrhea.  The patient stated at that time that he had quit smoking last month, and that is when the symptoms started.  Per the note from the emergency department he was "very paranoid" about eating anything other than sandwiches.  He had been seen by a telepsychiatry service earlier for anxiety, and it started him on Zoloft which led to the diarrhea.  He then presented again to the [facility] emergency department on 10/31 with those complaints.  He continued to complain about anxiety about eating.  He stated that he may eat 1 sandwich today, but has been able to drink fluids.  He had also been given a milligram of Ativan in the emergency room on his last visit, and did well.  He was also discharged on Zofran.  That gave some degree of relief.  He also reported a weight loss of between 30 and 40 pounds since all of this started.  Because of his continued psychiatric symptoms he was transferred [facility].  His comprehensive clinical assessment note stated that "my life was horrible".  He talked about quitting smoking cold [place] in September.  He stated since that time he has had stomach problems,  unable to eat, panic attacks, irritability, nauseousness and diarrhea.  He stated that he and his wife will be evicted from their home in 2 weeks, and they have nowhere to go.  This morning the patient stated he is currently living with his mother.  He apparently has a 31-year-old daughter who lives in [place] with her mother but is only able to see the child 3 weeks a year.  He started a new job at [employer] for [employer] earlier this month.  He was transferred to our facility.  He was started on mirtazapine as well as trazodone.  This morning he is significantly hung over from the trazodone.  He feels unstable on his feet currently.  He denied any suicidal or homicidal ideation.  He denied any auditory or visual hallucinations.  It was difficult to assess paranoia at this point.  He did state that he slept well last night.  He stated that he apparently had received Xanax from his father as a child, but stated that after he had started smoking he had had no anxiety problems throughout his life.  He denied any previous psychiatric admissions or psychiatric medications.  He denied any family history of psychiatric illness.  He was admitted to the hospital for evaluation and stabilization.  Associated Signs/Symptoms: Depression Symptoms:  depressed mood, anhedonia, insomnia, psychomotor agitation, fatigue, feelings of worthlessness/guilt, difficulty concentrating, hopelessness, suicidal thoughts without plan, anxiety, loss of energy/fatigue, disturbed sleep, weight loss, Duration of Depression Symptoms: No data recorded (Hypo) Manic Symptoms:  Impulsivity, Irritable Mood, Labiality of Mood, Anxiety Symptoms:  Excessive Worry, Psychotic Symptoms:  Paranoia, Duration of Psychotic Symptoms: No data recorded PTSD Symptoms: Negative Total Time spent with patient: 45 minutes  Past Psychiatric History: Patient denied any previous formal psychiatric treatment in the past prior to quitting  smoking.  He has seen a telepsychiatry service who prescribes Zoloft, and he was seen at the [facility] emergency department about approximately 10/25 for anxiety.  This was secondary to developing nausea and vomiting from the Zoloft.  He then presented on 10/31 back to [facility] department and then was transferred to the [facility] urgent care center.  Is the patient at risk to self? Yes.    Has the patient been a risk to self in the past 6 months? No.  Has the patient been a risk to self within the distant past? No.  Is the patient a risk to others? No.  Has the patient been a risk to others in the past 6 months? No.  Has the patient been a risk to others within the distant past? No.   Prior Inpatient Therapy:   Prior Outpatient Therapy:    Alcohol Screening: 1. How often do you have a drink containing alcohol?: Never 2. How many drinks containing alcohol do you have on a typical day when you are drinking?: 1 or 2 3. How often do you have six or more drinks on one occasion?: Never AUDIT-C Score: 0 4. How often during the last year have you found that you were not able to stop drinking once you had started?: Never 5. How often during the last year have you failed to do what was normally expected from you because of drinking?: Never 6. How often during the last year have you needed a first drink in the morning to get yourself going after a heavy drinking session?: Never 7. How often during the last year have you had a feeling of guilt of remorse after drinking?: Never 8. How often during the last year have you been unable to remember what happened the night before because you had been drinking?: Never 9. Have you or someone else been injured as a result of your drinking?: No 10. Has a relative or friend or a doctor or another health worker been concerned about your drinking or suggested you cut down?: No Alcohol Use Disorder  Identification Test Final Score (AUDIT): 0 Alcohol Brief Interventions/Follow-up: AUDIT Score <7 follow-up not indicated Substance Abuse History in the last 12 months:  Yes.   Consequences of Substance Abuse: Medical Consequences:  Since he quit smoking he has developed significant somatic symptoms as well as anxiety and depressive symptoms. Previous Psychotropic Medications: Yes  Psychological Evaluations: No  Past Medical History:  Past Medical History:  Diagnosis Date  . Acid reflux    History reviewed. No pertinent surgical history. Family History: History reviewed. No pertinent family history. Family Psychiatric  History: Denied Tobacco Screening: Have you used any form of tobacco in the last 30 days? (Cigarettes, Smokeless Tobacco, Cigars, and/or Pipes): No Social History:  Social History   Substance and Sexual Activity  Alcohol Use Never   Comment: occasionally     Social History   Substance and Sexual Activity  Drug Use Never    Additional Social History: Marital status: Married Number of Years Married: 6 What types of issues is patient dealing with in the relationship?: Pt and wife will need to find alternative housing options in 2 weeks. Additional relationship information: n/a Are you sexually active?: Yes What  is your sexual orientation?: Did not disclsoe Has your sexual activity been affected by drugs, alcohol, medication, or emotional stress?: Did not disclose Does patient have children?: Yes How many children?: 1 How is patient's relationship with their children?: Pt's daughter is from a previous relationship; he sees her 3 weeks a year, which is difficult for him. Pt stated that not seeing her attributes to stress as he is unable to afford seeing her. (She resides in [place])                         Allergies:   Allergies  Allergen Reactions  . Sertraline Hcl Diarrhea and Nausea And Vomiting   Lab Results:  Results for orders placed or performed  during the hospital encounter of 10/20/20 (from the past 48 hour(s))  Respiratory Panel by RT PCR (Flu A&B, Covid) - Nasopharyngeal Swab     Status: None   Collection Time: 10/20/20 10:46 PM   Specimen: Nasopharyngeal Swab  Result Value Ref Range   SARS Coronavirus 2 by RT PCR NEGATIVE NEGATIVE    Comment: (NOTE) SARS-CoV-2 target nucleic acids are NOT DETECTED.  The SARS-CoV-2 RNA is generally detectable in upper respiratoy specimens during the acute phase of infection. The lowest concentration of SARS-CoV-2 viral copies this assay can detect is 131 copies/mL. A negative result does not preclude SARS-Cov-2 infection and should not be used as the sole basis for treatment or other patient management decisions. A negative result may occur with  improper specimen collection/handling, submission of specimen other than nasopharyngeal swab, presence of viral mutation(s) within the areas targeted by this assay, and inadequate number of viral copies (<131 copies/mL). A negative result must be combined with clinical observations, patient history, and epidemiological information. The expected result is Negative.  Fact Sheet for Patients:  https://www.moore.com/  Fact Sheet for Healthcare Providers:  https://www.young.biz/  This test is no t yet approved or cleared by the [place] FDA and  has been authorized for detection and/or diagnosis of SARS-CoV-2 by FDA under an Emergency Use Authorization (EUA). This EUA will remain  in effect (meaning this test can be used) for the duration of the COVID-19 declaration under Section 564(b)(1) of the Act, 21 U.S.C. section 360bbb-3(b)(1), unless the authorization is terminated or revoked sooner.     Influenza A by PCR NEGATIVE NEGATIVE   Influenza B by PCR NEGATIVE NEGATIVE    Comment: (NOTE) The Xpert Xpress SARS-CoV-2/FLU/RSV assay is intended as an aid in  the diagnosis of influenza from  Nasopharyngeal swab specimens and  should not be used as a sole basis for treatment. Nasal washings and  aspirates are unacceptable for Xpert Xpress SARS-CoV-2/FLU/RSV  testing.  Fact Sheet for Patients: https://www.moore.com/  Fact Sheet for Healthcare Providers: https://www.young.biz/  This test is not yet approved or cleared by the [place] FDA and  has been authorized for detection and/or diagnosis of SARS-CoV-2 by  FDA under an Emergency Use Authorization (EUA). This EUA will remain  in effect (meaning this test can be used) for the duration of the  Covid-19 declaration under Section 564(b)(1) of the Act, 21  U.S.C. section 360bbb-3(b)(1), unless the authorization is  terminated or revoked. Performed at [facility], [address], [place], [place] [address]   Rapid urine drug screen (hospital performed)     Status: None   Collection Time: 10/20/20 11:30 PM  Result Value Ref Range   Opiates NONE DETECTED NONE DETECTED  Cocaine NONE DETECTED NONE DETECTED   Benzodiazepines NONE DETECTED NONE DETECTED   Amphetamines NONE DETECTED NONE DETECTED   Tetrahydrocannabinol NONE DETECTED NONE DETECTED   Barbiturates NONE DETECTED NONE DETECTED    Comment: (NOTE) DRUG SCREEN FOR MEDICAL PURPOSES ONLY.  IF CONFIRMATION IS NEEDED FOR ANY PURPOSE, NOTIFY LAB WITHIN 5 DAYS.  LOWEST DETECTABLE LIMITS FOR URINE DRUG SCREEN Drug Class                     Cutoff (ng/mL) Amphetamine and metabolites    1000 Barbiturate and metabolites    200 Benzodiazepine                 200 Tricyclics and metabolites     300 Opiates and metabolites        300 Cocaine and metabolites        300 THC                            50 Performed at [facility], [address] [place], [place] [address]   Urinalysis, Routine w reflex microscopic     Status: Abnormal   Collection Time: 10/20/20 11:30 PM  Result Value Ref  Range   Color, Urine YELLOW YELLOW   APPearance CLEAR CLEAR   Specific Gravity, Urine 1.008 1.005 - 1.030   pH 6.0 5.0 - 8.0   Glucose, UA NEGATIVE NEGATIVE mg/dL   Hgb urine dipstick SMALL (A) NEGATIVE   Bilirubin Urine NEGATIVE NEGATIVE   Ketones, ur NEGATIVE NEGATIVE mg/dL   Protein, ur NEGATIVE NEGATIVE mg/dL   Nitrite NEGATIVE NEGATIVE   Leukocytes,Ua NEGATIVE NEGATIVE   RBC / HPF 0-5 0 - 5 RBC/hpf   WBC, UA 0-5 0 - 5 WBC/hpf   Bacteria, UA NONE SEEN NONE SEEN   Mucus PRESENT     Comment: Performed at [facility], [address], [place], [place] [address]    Blood Alcohol level:  No results found for: [facility]  Metabolic Disorder Labs:  No results found for: HGBA1C, MPG No results found for: PROLACTIN No results found for: CHOL, TRIG, HDL, CHOLHDL, VLDL, LDLCALC  Current Medications: Current Facility-Administered Medications  Medication Dose Route Frequency Provider Last Rate Last Admin  . acetaminophen (TYLENOL) tablet 650 mg  650 mg Oral Q6H PRN Estella Husk, MD      . alum & mag hydroxide-simeth (MAALOX/MYLANTA) 200-200-20 MG/5ML suspension 30 mL  30 mL Oral Q4H PRN Estella Husk, MD      . hydrOXYzine (ATARAX/VISTARIL) tablet 25 mg  25 mg Oral TID PRN Estella Husk, MD   25 mg at 10/21/20 2108  . magnesium hydroxide (MILK OF MAGNESIA) suspension 30 mL  30 mL Oral Daily PRN Estella Husk, MD      . mirtazapine (REMERON) tablet 15 mg  15 mg Oral QHS Estella Husk, MD   15 mg at 10/21/20 2108  . ondansetron (ZOFRAN-ODT) disintegrating tablet 4 mg  4 mg Oral Q8H PRN Nira Conn A, NP   4 mg at 10/21/20 2108  . traZODone (DESYREL) tablet 50 mg  50 mg Oral QHS PRN Estella Husk, MD   50 mg at 10/21/20 2108   PTA Medications: Medications Prior to Admission  Medication Sig Dispense Refill Last Dose  . alum & mag hydroxide-simeth (MAALOX MAX) 400-400-40 MG/5ML suspension Take 10 mLs by mouth every 6 (six) hours as  needed for  indigestion. 355 mL 0   . bismuth subsalicylate (PEPTO BISMOL) 262 MG/15ML suspension Take 15 mLs by mouth every 6 (six) hours as needed for indigestion.     . dimenhyDRINATE (DRAMAMINE PO) Take 2 tablets by mouth daily as needed (nausea).     . hydrOXYzine (ATARAX/VISTARIL) 25 MG tablet Take 1 tablet (25 mg total) by mouth every 6 (six) hours as needed for anxiety. 12 tablet 0   . mirtazapine (REMERON) 15 MG tablet Take 1 tablet (15 mg total) by mouth at bedtime.     Marland Kitchen omeprazole (PRILOSEC) 20 MG capsule Take 20 mg by mouth in the morning.     . ondansetron (ZOFRAN ODT) 4 MG disintegrating tablet Take 1 tablet (4 mg total) by mouth every 8 (eight) hours as needed for nausea or vomiting. 20 tablet 0     Musculoskeletal: Strength & Muscle Tone: within normal limits Gait & Station: normal Patient leans: N/A  Psychiatric Specialty Exam: Physical Exam Vitals and nursing note reviewed.  HENT:     Head: Normocephalic and atraumatic.  Pulmonary:     Effort: Pulmonary effort is normal.  Neurological:     General: No focal deficit present.     Mental Status: He is alert and oriented to person, place, and time.     Review of Systems  Blood pressure 108/75, pulse (!) 119, temperature 98.1 F (36.7 C), temperature source Oral, resp. rate 18, height 6\' 1"  (1.854 m), weight 83.5 kg, SpO2 99 %.Body mass index is 24.28 kg/m.  General Appearance: Disheveled  Eye Contact:  Minimal  Speech:  Normal Rate  Volume:  Decreased  Mood:  Anxious, Depressed and Dysphoric  Affect:  Congruent  Thought Process:  Coherent and Descriptions of Associations: Intact  Orientation:  Full (Time, Place, and Person)  Thought Content:  Rumination  Suicidal Thoughts:  No  Homicidal Thoughts:  No  Memory:  Immediate;   Fair Recent;   Fair Remote;   Fair  Judgement:  Intact  Insight:  Fair  Psychomotor Activity:  Decreased  Concentration:  Concentration: Fair  Recall:  of Knowledge:  Fair   Language:  Good  Akathisia:  Negative  Handed:  Right  AIMS (if indicated):     Assets:  Desire for Improvement Resilience  ADL's:  Intact  Cognition:  WNL  Sleep:  Number of Hours: 6.75    Treatment Plan Summary: Daily contact with patient to assess and evaluate symptoms and progress in treatment, Medication management and Plan : Patient is seen and examined.  Patient is a 29 year old male with the above-stated past psychiatric history who was admitted secondary to continued weight loss, anxiety and depression.  He will be admitted to the hospital.  He will be integrated in the milieu.  He will be encouraged to attend groups.  He has been started on mirtazapine 15 mg p.o. nightly.  We will continue that.  We will leave the trazodone alone at 50 mg p.o. nightly but make sure its as needed.  He will also have available hydroxyzine.  This will be for anxiety.  We will also write for Zofran for nausea if necessary.  Review of his admission laboratories show essentially normal electrolytes.  His CBC is essentially normal.  Differential is normal.  Urinalysis showed a small amount of hemoglobin.  No bacteria was noted.  Otherwise was essentially normal.  Urine drug screen was negative.  EKG showed a normal sinus rhythm with a normal QTc interval.  TSH  was not ordered, but I have added that onto previously drawn blood.  We will see what we can find out to try and stabilize the situation.  Observation Level/Precautions:  15 minute checks  Laboratory:  Chemistry Profile  Psychotherapy:    Medications:    Consultations:    Discharge Concerns:    Estimated LOS:  Other:     Physician Treatment Plan for Primary Diagnosis: <principal problem not specified> Long Term Goal(s): Improvement in symptoms so as ready for discharge  Short Term Goals: Ability to identify changes in lifestyle to reduce recurrence of condition will improve, Ability to verbalize feelings will improve, Ability to disclose and  discuss suicidal ideas, Ability to demonstrate self-control will improve, Ability to identify and develop effective coping behaviors will improve and Ability to maintain clinical measurements within normal limits will improve  Physician Treatment Plan for Secondary Diagnosis: Active Problems:   MDD (major depressive disorder), single episode, moderate (HCC)  Long Term Goal(s): Improvement in symptoms so as ready for discharge  Short Term Goals: Ability to identify changes in lifestyle to reduce recurrence of condition will improve, Ability to verbalize feelings will improve, Ability to disclose and discuss suicidal ideas, Ability to demonstrate self-control will improve, Ability to identify and develop effective coping behaviors will improve and Ability to maintain clinical measurements within normal limits will improve  I certify that inpatient services furnished can reasonably be expected to improve the patient's condition.    Antonieta Pert, MD 11/2/20213:07 PM
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
## 2020-10-22 NOTE — Progress Notes (Signed)
[facility] Group Notes:  (Nursing/MHT/Case Management/Adjunct)  Date:  10/22/2020  Time:  2030 Type of Therapy:  wrap up group  Participation Level:  Active  Participation Quality:  Appropriate, Attentive, Sharing and Supportive  Affect:  Anxious and Blunted  Cognitive:  Appropriate  Insight:  Improving  Engagement in Group:  Engaged  Modes of Intervention:  Clarification, Education and Support  Summary of Progress/Problems: Positive thinking and positive change were discussed.   Marcille Buffy 10/22/2020, 9:37 PM

## 2020-10-22 NOTE — Progress Notes (Signed)
   10/22/20 2000  Psych Admission Type (Psych Patients Only)  Admission Status Voluntary  Psychosocial Assessment  Patient Complaints None  Eye Contact Fair  Facial Expression Anxious  Affect Appropriate to circumstance  Speech Logical/coherent  Interaction Assertive  Motor Activity Restless;Pacing  Appearance/Hygiene Unremarkable  Behavior Characteristics Cooperative  Mood Pleasant  Thought Process  Coherency WDL  Content WDL  Delusions None reported or observed  Perception WDL  Hallucination None reported or observed  Judgment Impaired  Confusion None  Danger to Self  Current suicidal ideation? Denies  Danger to Others  Danger to Others None reported or observed
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
## 2020-10-22 NOTE — Progress Notes (Signed)
   10/22/20 1000  Psych Admission Type (Psych Patients Only)  Admission Status Voluntary  Psychosocial Assessment  Patient Complaints Anxiety  Eye Contact Avoids;Poor  Facial Expression Anxious;Sad  Affect Anxious;Sad;Depressed  Speech Logical/coherent  Interaction Cautious;Guarded;Minimal  Motor Activity Fidgety;Restless  Appearance/Hygiene Unremarkable  Behavior Characteristics Cooperative  Mood Anxious  Thought Process  Coherency WDL  Content WDL  Delusions WDL  Perception WDL  Hallucination None reported or observed  Judgment Impaired  Confusion None  Danger to Self  Current suicidal ideation? Denies  Danger to Others  Danger to Others None reported or observed

## 2020-10-22 NOTE — [facility] Suicide Risk Assessment (Signed)
[facility] Admission Suicide Risk Assessment   Nursing information obtained from:  Patient Demographic factors:  Male, Caucasian, Low socioeconomic status Current Mental Status:  NA Loss Factors:  Financial problems / change in socioeconomic status Historical Factors:  NA Risk Reduction Factors:  Living with another person, especially a relative, Employed  Total Time spent with patient: 30 minutes Principal Problem: <principal problem not specified> Diagnosis:  Active Problems:   MDD (major depressive disorder), single episode, moderate (HCC)  Subjective Data: Patient is seen and examined.  Patient is a 29 year old male with a past psychiatric history significant for new onset anxiety and depression after attempting to quit smoking who originally presented to the [facility] emergency department on 10/14/2020.  He was complaining of "paranoia" as well as abdominal pain and diarrhea.  The patient stated at that time that he had quit smoking last month, and that is when the symptoms started.  Per the note from the emergency department he was "very paranoid" about eating anything other than sandwiches.  He had been seen by a telepsychiatry service earlier for anxiety, and it started him on Zoloft which led to the diarrhea.  He then presented again to the [facility] emergency department on 10/31 with those complaints.  He continued to complain about anxiety about eating.  He stated that he may eat 1 sandwich today, but has been able to drink fluids.  He had also been given a milligram of Ativan in the emergency room on his last visit, and did well.  He was also discharged on Zofran.  That gave some degree of relief.  He also reported a weight loss of between 30 and 40 pounds since all of this started.  Because of his continued psychiatric symptoms he was transferred to the [facility] after his evaluation at [facility].  His comprehensive  clinical assessment note stated that "my life was horrible".  He talked about quitting smoking cold [place] in September.  He stated since that time he has had stomach problems, unable to eat, panic attacks, irritability, nauseousness and diarrhea.  He stated that he and his wife will be evicted from their home in 2 weeks, and they have nowhere to go.  This morning the patient stated he is currently living with his mother.  He apparently has a 45-year-old daughter who lives in [place] with her mother but is only able to see the child 3 weeks a year.  He started a new job at [employer] center for [employer] earlier this month.  He was transferred to our facility.  He was started on mirtazapine as well as trazodone.  This morning he is significantly hung over from the trazodone.  He feels unstable on his feet currently.  He denied any suicidal or homicidal ideation.  He denied any auditory or visual hallucinations.  It was difficult to assess paranoia at this point.  He did state that he slept well last night.  He stated that he apparently had received Xanax from his father as a child, but stated that after he had started smoking he had had no anxiety problems throughout his life.  He denied any previous psychiatric admissions or psychiatric medications.  He denied any family history of psychiatric illness.  He was admitted to the hospital for evaluation and stabilization.  Continued Clinical Symptoms:  Alcohol Use Disorder Identification Test Final Score (AUDIT): 0 The "Alcohol Use Disorders Identification Test", Guidelines for Use in Primary Care,  Second Edition.  World Science writer [facility]). Score between 0-7:  no or low risk or alcohol related problems. Score between 8-15:  moderate risk of alcohol related problems. Score between 16-19:  high risk of alcohol related problems. Score 20 or above:  warrants further diagnostic evaluation for alcohol dependence and treatment.   CLINICAL FACTORS:   Severe  Anxiety and/or Agitation Depression:   Anhedonia Hopelessness Impulsivity Insomnia   Musculoskeletal: Strength & Muscle Tone: within normal limits Gait & Station: unsteady Patient leans: N/A  Psychiatric Specialty Exam: Physical Exam Vitals and nursing note reviewed.  HENT:     Head: Normocephalic and atraumatic.  Pulmonary:     Effort: Pulmonary effort is normal.  Neurological:     General: No focal deficit present.     Mental Status: He is alert and oriented to person, place, and time.     Review of Systems  Blood pressure 118/80, pulse 72, temperature 98.6 F (37 C), temperature source Oral, resp. rate 18, height 6\' 1"  (1.854 m), weight 83.5 kg, SpO2 100 %.Body mass index is 24.28 kg/m.  General Appearance: Disheveled  Eye Contact:  Minimal  Speech:  Normal Rate  Volume:  Decreased  Mood:  Dysphoric and Sedated  Affect:  Congruent  Thought Process:  Coherent and Descriptions of Associations: Circumstantial  Orientation:  Full (Time, Place, and Person)  Thought Content:  Rumination  Suicidal Thoughts:  No  Homicidal Thoughts:  No  Memory:  Immediate;   Fair Recent;   Fair Remote;   Fair  Judgement:  Intact  Insight:  Fair  Psychomotor Activity:  Decreased  Concentration:  Concentration: Fair and Attention Span: Fair  Recall:  of Knowledge:  Fair  Language:  Fair  Akathisia:  Negative  Handed:  Right  AIMS (if indicated):     Assets:  Desire for Improvement Resilience  ADL's:  Intact  Cognition:  WNL  Sleep:  Number of Hours: 6.75      COGNITIVE FEATURES THAT CONTRIBUTE TO RISK:  Thought constriction (tunnel vision)    SUICIDE RISK:   Moderate:  Frequent suicidal ideation with limited intensity, and duration, some specificity in terms of plans, no associated intent, good self-control, limited dysphoria/symptomatology, some risk factors present, and identifiable protective factors, including available and accessible social support.  PLAN  OF CARE: Patient is seen and examined.  Patient is a 29 year old male with the above-stated past psychiatric history who was admitted secondary to continued weight loss, anxiety and depression.  He will be admitted to the hospital.  He will be integrated in the milieu.  He will be encouraged to attend groups.  He has been started on mirtazapine 15 mg p.o. nightly.  We will continue that.  We will leave the trazodone alone at 50 mg p.o. nightly but make sure its as needed.  He will also have available hydroxyzine.  This will be for anxiety.  We will also write for Zofran for nausea if necessary.  Review of his admission laboratories show essentially normal electrolytes.  His CBC is essentially normal.  Differential is normal.  Urinalysis showed a small amount of hemoglobin.  No bacteria was noted.  Otherwise was essentially normal.  Urine drug screen was negative.  EKG showed a normal sinus rhythm with a normal QTc interval.  TSH was not ordered, but I have added that onto previously drawn blood.  We will see what we can find out to try and stabilize the situation.  I certify that  inpatient services furnished can reasonably be expected to improve the patient's condition.   Antonieta Pert, MD 10/22/2020, 8:06 AM

## 2020-10-23 MED ORDER — FAMOTIDINE 40 MG PO TABS
40.0000 mg | ORAL_TABLET | Freq: Two times a day (BID) | ORAL | 0 refills | Status: DC
Start: 2020-10-23 — End: 2020-11-20

## 2020-10-23 MED ORDER — MIRTAZAPINE 15 MG PO TABS: 15.0000 mg | ORAL_TABLET | Freq: Every day | ORAL | 0 refills | Status: DC

## 2020-10-23 MED ORDER — HYDROXYZINE HCL 25 MG PO TABS: 25.0000 mg | ORAL_TABLET | Freq: Three times a day (TID) | ORAL | 0 refills | Status: DC | PRN

## 2020-10-23 NOTE — Discharge Summary (Signed)
Physician Discharge Summary Note  Patient:  Jeremiah Roberts is an 29 y.o., male MRN:  [id number] DOB:  1991/02/12 Patient phone:  [phone] (home)  Patient address:   [address] [place] [address],  Total Time spent with patient: 15 minutes  Date of Admission:  10/21/2020 Date of Discharge: 10/23/20  Reason for Admission:  Anxiety and paranoia  Principal Problem: <principal problem not specified> Discharge Diagnoses: Active Problems:   MDD (major depressive disorder), single episode, moderate (HCC)   Past Psychiatric History: Patient denied any previous formal psychiatric treatment in the past prior to quitting smoking.  He has seen a telepsychiatry service who prescribes Zoloft, and he was seen at the [facility] emergency department about approximately 10/25 for anxiety.  This was secondary to developing nausea and vomiting from the Zoloft.  He then presented on 10/31 back to the [facility] emergency department and then was transferred to the [facility] urgent care center.  Past Medical History:  Past Medical History:  Diagnosis Date  . Acid reflux    History reviewed. No pertinent surgical history. Family History: History reviewed. No pertinent family history. Family Psychiatric  History: Denies Social History:  Social History   Substance and Sexual Activity  Alcohol Use Never   Comment: occasionally     Social History   Substance and Sexual Activity  Drug Use Never    Social History   Socioeconomic History  . Marital status: Married    Spouse name: Not on file  . Number of children: Not on file  . Years of education: Not on file  . Highest education level: Not on file  Occupational History  . Not on file  Tobacco Use  . Smoking status: Former Smoker    Types: Cigars  . Smokeless tobacco: Never Used  . Tobacco comment: repotrs quit cold [place] beginning of Oct 2021  Vaping Use  . Vaping Use: Never used   Substance and Sexual Activity  . Alcohol use: Never    Comment: occasionally  . Drug use: Never  . Sexual activity: Not on file  Other Topics Concern  . Not on file  Social History Narrative  . Not on file   Social Determinants of Health   Financial Resource Strain:   . Difficulty of Paying Living Expenses: Not on file  Food Insecurity:   . Worried About Programme researcher, broadcasting/film/video in the Last Year: Not on file  . Ran Out of Food in the Last Year: Not on file  Transportation Needs:   . Lack of Transportation (Medical): Not on file  . Lack of Transportation (Non-Medical): Not on file  Physical Activity:   . Days of Exercise per Week: Not on file  . Minutes of Exercise per Session: Not on file  Stress:   . Feeling of Stress : Not on file  Social Connections:   . Frequency of Communication with Friends and Family: Not on file  . Frequency of Social Gatherings with Friends and Family: Not on file  . Attends Religious Services: Not on file  . Active Member of Clubs or Organizations: Not on file  . Attends Banker Meetings: Not on file  . Marital Status: Not on file    Hospital Course:  From admission H&P: Patient is a 29 year old male with a past psychiatric history significant for new onset anxiety and depression after attempting to quit smoking who originally presented to the [facility] emergency department on  10/14/2020. He was complaining of "paranoia" as well as abdominal pain and diarrhea. The patient stated at that time that he had quit smoking last month, and that is when the symptoms started. Per the note from the emergency department he was "very paranoid" about eating anything other than sandwiches. He had been seen by a telepsychiatry service earlier for anxiety, and it started him on Zoloft which led to the diarrhea. He then presented again to the [facility] emergency department on 10/31 with those complaints. He continued  to complain about anxiety about eating. He stated that he may eat 1 sandwich today, but has been able to drink fluids. He had also been given a milligram of Ativan in the emergency room on his last visit, and did well. He was also discharged on Zofran. That gave some degree of relief. He also reported a weight loss of between 30 and 40 pounds since all of this started. Because of his continued psychiatric symptoms he was [facility]. His comprehensive clinical assessment note stated that "my life was horrible". He talked about quitting smoking cold [place] in September. He stated since that time he has had stomach problems, unable to eat, panic attacks, irritability, nauseousness and diarrhea. He stated that he and his wife will be evicted from their home in 2 weeks, and they have nowhere to go. This morning the patient stated he is currently living with his mother. He apparently has a 31-year-old daughter who lives in [place] with her mother but is only able to see the child 3 weeks a year. He started a new job at [employer] for [employer] earlier this month. He was transferred to our facility. He was started on mirtazapine as well as trazodone. This morning he is significantly hung over from the trazodone. He feels unstable on his feet currently. He denied any suicidal or homicidal ideation. He denied any auditory or visual hallucinations. It was difficult to assess paranoia at this point. He did state that he slept well last night. He stated that he apparently had received Xanax from his father as a child, but stated that after he had started smoking he had had no anxiety problems throughout his life. He denied any previous psychiatric admissions or psychiatric medications. He denied any family history of psychiatric illness. He was admitted to the hospital for evaluation and stabilization.  Jeremiah Roberts was admitted for anxiety and reports of paranoia. [facility]. He was started on Remeron and PRN Vistaril. He participated in group therapy on the unit. He responded well to treatment with no adverse effects reported. He has shown improved mood, affect, sleep, and interaction. He denies any SI/HI/AVH and contracts for safety. He is discharging on the medications listed below. He agrees to follow [facility] (see below). Patient is provided with prescriptions and medication samples upon discharge. His wife is picking him up for discharge home.  Physical Findings: AIMS: Facial and Oral Movements Muscles of Facial Expression: None, normal Lips and Perioral Area: None, normal Jaw: None, normal Tongue: None, normal,Extremity Movements Upper (arms, wrists, hands, fingers): None, normal Lower (legs, knees, ankles, toes): None, normal, Trunk Movements Neck, shoulders, hips: None, normal, Overall Severity Severity of abnormal movements (highest score from questions above): None, normal Incapacitation due to abnormal movements: None, normal Patient's awareness of abnormal movements (rate only patient's report): No  Awareness, Dental Status Current problems with teeth and/or dentures?: No Does patient usually wear dentures?: No  CIWA:    COWS:     Musculoskeletal: Strength & Muscle Tone: within normal limits Gait & Station: normal Patient leans: N/A  Psychiatric Specialty Exam: Physical Exam Vitals and nursing note reviewed.  Constitutional:      Appearance: He is well-developed.  Pulmonary:     Effort: Pulmonary effort is normal.  Neurological:     Mental Status: He is alert and oriented to person, place, and time.     Review of Systems  Constitutional: Negative.   Respiratory: Negative for cough and shortness of breath.   Psychiatric/Behavioral: Negative for agitation, behavioral problems, confusion, decreased concentration, dysphoric  mood, hallucinations, self-injury, sleep disturbance and suicidal ideas. The patient is not nervous/anxious and is not hyperactive.     Blood pressure 104/81, pulse (!) 117, temperature 97.9 F (36.6 C), temperature source Oral, resp. rate 18, height 6\' 1"  (1.854 m), weight 83.5 kg, SpO2 99 %.Body mass index is 24.28 kg/m.  See MD's discharge SRA    Have you used any form of tobacco in the last 30 days? (Cigarettes, Smokeless Tobacco, Cigars, and/or Pipes): No  Has this patient used any form of tobacco in the last 30 days? (Cigarettes, Smokeless Tobacco, Cigars, and/or Pipes)  No  Blood Alcohol level:  No results found for: [facility]  Metabolic Disorder Labs:  No results found for: HGBA1C, MPG No results found for: PROLACTIN No results found for: CHOL, TRIG, HDL, CHOLHDL, VLDL, LDLCALC  See Psychiatric Specialty Exam and Suicide Risk Assessment completed by Attending Physician prior to discharge.  Discharge destination:  Home  Is patient on multiple antipsychotic therapies at discharge:  No   Has Patient had three or more failed trials of antipsychotic monotherapy by history:  No  Recommended Plan for Multiple Antipsychotic Therapies: NA  Discharge Instructions    Diet - low sodium heart healthy   Complete by: As directed    Increase activity slowly   Complete by: As directed    Increase activity slowly   Complete by: As directed      Allergies as of 10/23/2020      Reactions   Sertraline Hcl Diarrhea, Nausea And Vomiting      Medication List    STOP taking these medications   alum & mag hydroxide-simeth 400-400-40 MG/5ML suspension Commonly known as: Maalox Max   bismuth subsalicylate 262 MG/15ML suspension Commonly known as: PEPTO BISMOL   DRAMAMINE PO   omeprazole 20 MG capsule Commonly known as: PRILOSEC     TAKE these medications     Indication  famotidine 40 MG tablet Commonly known as: PEPCID Take 1 tablet (40 mg total) by mouth 2 (two) times daily.   Indication: Gastroesophageal Reflux Disease   hydrOXYzine 25 MG tablet Commonly known as: ATARAX/VISTARIL Take 1 tablet (25 mg total) by mouth 3 (three) times daily as needed for anxiety. What changed: when to take this  Indication: Feeling Anxious   mirtazapine 15 MG tablet Commonly known as: REMERON Take 1 tablet (15 mg total) by mouth at bedtime.  Indication: Major Depressive Disorder   ondansetron 4 MG disintegrating tablet Commonly known as: Zofran ODT Take 1 tablet (4 mg total) by mouth every 8 (eight) hours as needed for nausea or vomiting.  Indication: Nausea and Vomiting       Follow-up Information    [facility]. Go on 10/25/2020.   Specialty: Behavioral Health Why:  You have a walk in appointment on 10/25/20 at 12:30 pm  for therapy services.  You also have an appointment for medication management on 11/20/20 at 8:30 .  These appointments will be held in person. Contact information: [address] [place] [address] [phone]              Follow-up recommendations: Activity as tolerated. Diet as recommended by primary care physician. Keep all scheduled follow-up appointments as recommended.   Comments:   Patient is instructed to take all prescribed medications as recommended. Report any side effects or adverse reactions to your outpatient psychiatrist. Patient is instructed to abstain from alcohol and illegal drugs while on prescription medications. In the event of worsening symptoms, patient is instructed to call the crisis hotline, 911, or go to the nearest emergency department for evaluation and treatment.  Signed: Aldean Baker, NP 10/23/2020, 10:18 AM

## 2020-10-23 NOTE — Progress Notes (Signed)
  [facility] Adult Case Management Discharge Plan :  Will you be returning to the same living situation after discharge:  Yes,  with wife and other family.; At discharge, do you have transportation home?: Yes,  wife providing transportation at approx. 1015. Do you have the ability to pay for your medications: No.  Release of information consent forms completed and in the chart;  Patient's signature needed at discharge.  Patient to Follow up at:  Follow-up Information    [facility] Follow up.   Specialty: Behavioral Health Contact information: [address] [place] [address] [phone]              Next level of care provider has access to [facility] Link:yes  Safety Planning and Suicide Prevention discussed: Yes,  completed with Pt's wife. Pt provided with SPI pamphlet.   Have you used any form of tobacco in the last 30 days? (Cigarettes, Smokeless Tobacco, Cigars, and/or Pipes): No  Has patient been referred to the Quitline?: N/A patient is not a smoker  Patient has been referred for addiction treatment: N/A  Jacinta Shoe, LCSW 10/23/2020, 9:23 AM

## 2020-10-23 NOTE — Plan of Care (Signed)
  Problem: Education: Goal: Knowledge of [facility] Education information/materials will improve Outcome: Adequate for Discharge Goal: Emotional status will improve Outcome: Adequate for Discharge Goal: Mental status will improve Outcome: Adequate for Discharge Goal: Verbalization of understanding the information provided will improve Outcome: Adequate for Discharge   Problem: Activity: Goal: Interest or engagement in activities will improve Outcome: Adequate for Discharge Goal: Sleeping patterns will improve Outcome: Adequate for Discharge   Problem: Coping: Goal: Ability to verbalize frustrations and anger appropriately will improve Outcome: Adequate for Discharge Goal: Ability to demonstrate self-control will improve Outcome: Adequate for Discharge   Problem: Health Behavior/Discharge Planning: Goal: Identification of resources available to assist in meeting health care needs will improve Outcome: Adequate for Discharge Goal: Compliance with treatment plan for underlying cause of condition will improve Outcome: Adequate for Discharge   Problem: Physical Regulation: Goal: Ability to maintain clinical measurements within normal limits will improve Outcome: Adequate for Discharge   Problem: Safety: Goal: Periods of time without injury will increase Outcome: Adequate for Discharge   Problem: Education: Goal: Ability to state activities that reduce stress will improve Outcome: Adequate for Discharge   Problem: Coping: Goal: Ability to identify and develop effective coping behavior will improve Outcome: Adequate for Discharge   Problem: Self-Concept: Goal: Ability to identify factors that promote anxiety will improve Outcome: Adequate for Discharge Goal: Level of anxiety will decrease Outcome: Adequate for Discharge Goal: Ability to modify response to factors that promote anxiety will improve Outcome: Adequate for Discharge   Problem: Education: Goal:  Utilization of techniques to improve thought processes will improve Outcome: Adequate for Discharge Goal: Knowledge of the prescribed therapeutic regimen will improve Outcome: Adequate for Discharge   Problem: Activity: Goal: Interest or engagement in leisure activities will improve Outcome: Adequate for Discharge Goal: Imbalance in normal sleep/wake cycle will improve Outcome: Adequate for Discharge   Problem: Coping: Goal: Coping ability will improve Outcome: Adequate for Discharge Goal: Will verbalize feelings Outcome: Adequate for Discharge   Problem: Health Behavior/Discharge Planning: Goal: Ability to make decisions will improve Outcome: Adequate for Discharge Goal: Compliance with therapeutic regimen will improve Outcome: Adequate for Discharge   Problem: Role Relationship: Goal: Will demonstrate positive changes in social behaviors and relationships Outcome: Adequate for Discharge   Problem: Safety: Goal: Ability to disclose and discuss suicidal ideas will improve Outcome: Adequate for Discharge Goal: Ability to identify and utilize support systems that promote safety will improve Outcome: Adequate for Discharge   Problem: Self-Concept: Goal: Will verbalize positive feelings about self Outcome: Adequate for Discharge Goal: Level of anxiety will decrease Outcome: Adequate for Discharge  Patient discharged to home/self care

## 2020-10-23 NOTE — BHH Suicide Risk Assessment (Signed)
[facility] Discharge Suicide Risk Assessment   Principal Problem: <principal problem not specified> Discharge Diagnoses: Active Problems:   MDD (major depressive disorder), single episode, moderate (HCC)   Total Time spent with patient: 20 minutes  Musculoskeletal: Strength & Muscle Tone: within normal limits Gait & Station: normal Patient leans: N/A  Psychiatric Specialty Exam: Review of Systems  All other systems reviewed and are negative.   Blood pressure 104/81, pulse (!) 117, temperature 97.9 F (36.6 C), temperature source Oral, resp. rate 18, height 6\' 1"  (1.854 m), weight 83.5 kg, SpO2 99 %.Body mass index is 24.28 kg/m.  General Appearance: Casual  Eye Contact::  Good  Speech:  Normal Rate409  Volume:  Normal  Mood:  Euthymic  Affect:  Congruent  Thought Process:  Coherent and Descriptions of Associations: Intact  Orientation:  Full (Time, Place, and Person)  Thought Content:  Logical  Suicidal Thoughts:  No  Homicidal Thoughts:  No  Memory:  Immediate;   Good Recent;   Good Remote;   Good  Judgement:  Intact  Insight:  Fair  Psychomotor Activity:  Increased  Concentration:  Good  Recall:  Good  Fund of Knowledge:Good  Language: Good  Akathisia:  Negative  Handed:  Right  AIMS (if indicated):     Assets:  Desire for Improvement Housing Resilience Social Support  Sleep:  Number of Hours: 6.75  Cognition: WNL  ADL's:  Intact   Mental Status Per Nursing Assessment::   On Admission:  NA  Demographic Factors:  Male, Caucasian and Low socioeconomic status  Loss Factors: Financial problems/change in socioeconomic status  Historical Factors: Impulsivity  Risk Reduction Factors:   Employed, Living with another person, especially a relative and Positive social support  Continued Clinical Symptoms:  Severe Anxiety and/or Agitation Depression:   Impulsivity  Cognitive Features That Contribute To Risk:  None    Suicide Risk:  Minimal: No identifiable  suicidal ideation.  Patients presenting with no risk factors but with morbid ruminations; may be classified as minimal risk based on the severity of the depressive symptoms    Plan Of Care/Follow-up recommendations:  Activity:  ad lib  [phone], MD 10/23/2020, 8:17 AM

## 2020-10-23 NOTE — Progress Notes (Signed)
Patient denies SI, HI and AVH upon discharge.  Patient is eager to return home and go to work.  Patient stated he is feeling better since he started his medications and is hopeful since starting a new job.  Patient acknowledged understanding of all discharge instructions and receipt of all personal belongings.  Patient will follow up with psychiatric care at [facility].

## 2020-10-23 NOTE — Tx Team (Signed)
Interdisciplinary Treatment and Diagnostic Plan Update  10/23/2020 Time of Session: 9:45am Jeremiah Roberts MRN: [id number]  Principal Diagnosis: <principal problem not specified>  Secondary Diagnoses: Active Problems:   MDD (major depressive disorder), single episode, moderate (HCC)   Current Medications:  Current Facility-Administered Medications  Medication Dose Route Frequency Provider Last Rate Last Admin  . acetaminophen (TYLENOL) tablet 650 mg  650 mg Oral Q6H PRN Estella Husk, MD      . alum & mag hydroxide-simeth (MAALOX/MYLANTA) 200-200-20 MG/5ML suspension 30 mL  30 mL Oral Q4H PRN Estella Husk, MD      . famotidine (PEPCID) tablet 40 mg  40 mg Oral BID Cindi Carbon, RPH   40 mg at 10/23/20 0745  . hydrOXYzine (ATARAX/VISTARIL) tablet 25 mg  25 mg Oral TID PRN Estella Husk, MD   25 mg at 10/21/20 2108  . magnesium hydroxide (MILK OF MAGNESIA) suspension 30 mL  30 mL Oral Daily PRN Estella Husk, MD      . mirtazapine (REMERON) tablet 15 mg  15 mg Oral QHS Estella Husk, MD   15 mg at 10/22/20 2122  . ondansetron (ZOFRAN-ODT) disintegrating tablet 4 mg  4 mg Oral Q8H PRN Nira Conn A, NP   4 mg at 10/21/20 2108  . traZODone (DESYREL) tablet 50 mg  50 mg Oral QHS PRN Estella Husk, MD   50 mg at 10/21/20 2108   Current Outpatient Medications  Medication Sig Dispense Refill  . famotidine (PEPCID) 40 MG tablet Take 1 tablet (40 mg total) by mouth 2 (two) times daily. 60 tablet 0  . hydrOXYzine (ATARAX/VISTARIL) 25 MG tablet Take 1 tablet (25 mg total) by mouth 3 (three) times daily as needed for anxiety. 30 tablet 0  . mirtazapine (REMERON) 15 MG tablet Take 1 tablet (15 mg total) by mouth at bedtime. 30 tablet 0  . ondansetron (ZOFRAN ODT) 4 MG disintegrating tablet Take 1 tablet (4 mg total) by mouth every 8 (eight) hours as needed for nausea or vomiting. 20 tablet 0   PTA Medications: No medications prior to admission.     Patient Stressors: Financial difficulties Loss of housing Marital or family conflict Medication change or noncompliance  Patient Strengths: Capable of independent living Motivation for treatment/growth  Treatment Modalities: Medication Management, Group therapy, Case management,  1 to 1 session with clinician, Psychoeducation, Recreational therapy.   Physician Treatment Plan for Primary Diagnosis: <principal problem not specified> Long Term Goal(s): Improvement in symptoms so as ready for discharge Improvement in symptoms so as ready for discharge   Short Term Goals: Ability to identify changes in lifestyle to reduce recurrence of condition will improve Ability to verbalize feelings will improve Ability to disclose and discuss suicidal ideas Ability to demonstrate self-control will improve Ability to identify and develop effective coping behaviors will improve Ability to maintain clinical measurements within normal limits will improve Ability to identify changes in lifestyle to reduce recurrence of condition will improve Ability to verbalize feelings will improve Ability to disclose and discuss suicidal ideas Ability to demonstrate self-control will improve Ability to identify and develop effective coping behaviors will improve Ability to maintain clinical measurements within normal limits will improve  Medication Management: Evaluate patient's response, side effects, and tolerance of medication regimen.  Therapeutic Interventions: 1 to 1 sessions, Unit Group sessions and Medication administration.  Evaluation of Outcomes: Adequate for Discharge  Physician Treatment Plan for Secondary Diagnosis: Active Problems:   MDD (major depressive disorder), single  episode, moderate (HCC)  Long Term Goal(s): Improvement in symptoms so as ready for discharge Improvement in symptoms so as ready for discharge   Short Term Goals: Ability to identify changes in lifestyle to reduce  recurrence of condition will improve Ability to verbalize feelings will improve Ability to disclose and discuss suicidal ideas Ability to demonstrate self-control will improve Ability to identify and develop effective coping behaviors will improve Ability to maintain clinical measurements within normal limits will improve Ability to identify changes in lifestyle to reduce recurrence of condition will improve Ability to verbalize feelings will improve Ability to disclose and discuss suicidal ideas Ability to demonstrate self-control will improve Ability to identify and develop effective coping behaviors will improve Ability to maintain clinical measurements within normal limits will improve     Medication Management: Evaluate patient's response, side effects, and tolerance of medication regimen.  Therapeutic Interventions: 1 to 1 sessions, Unit Group sessions and Medication administration.  Evaluation of Outcomes: Adequate for Discharge   RN Treatment Plan for Primary Diagnosis: <principal problem not specified> Long Term Goal(s): Knowledge of disease and therapeutic regimen to maintain health will improve  Short Term Goals: Ability to verbalize frustration and anger appropriately will improve, Ability to identify and develop effective coping behaviors will improve and Compliance with prescribed medications will improve  Medication Management: RN will administer medications as ordered by provider, will assess and evaluate patient's response and provide education to patient for prescribed medication. RN will report any adverse and/or side effects to prescribing provider.  Therapeutic Interventions: 1 on 1 counseling sessions, Psychoeducation, Medication administration, Evaluate responses to treatment, Monitor vital signs and CBGs as ordered, Perform/monitor CIWA, COWS, AIMS and Fall Risk screenings as ordered, Perform wound care treatments as ordered.  Evaluation of Outcomes: Adequate for  Discharge   LCSW Treatment Plan for Primary Diagnosis: <principal problem not specified> Long Term Goal(s): Safe transition to appropriate next level of care at discharge, Engage patient in therapeutic group addressing interpersonal concerns.  Short Term Goals: Engage patient in aftercare planning with referrals and resources, Increase social support, Identify triggers associated with mental health/substance abuse issues and Increase skills for wellness and recovery  Therapeutic Interventions: Assess for all discharge needs, 1 to 1 time with Social worker, Explore available resources and support systems, Assess for adequacy in community support network, Educate family and significant other(s) on suicide prevention, Complete Psychosocial Assessment, Interpersonal group therapy.  Evaluation of Outcomes: Adequate for Discharge   Progress in Treatment: Attending groups: Yes. Participating in groups: Yes. Taking medication as prescribed: Yes. Toleration medication: Yes. Family/Significant other contact made: Yes, individual(s) contacted:  with wife Patient understands diagnosis: Yes. Discussing patient identified problems/goals with staff: Yes. Medical problems stabilized or resolved: Yes. Denies suicidal/homicidal ideation: Yes. Issues/concerns per patient self-inventory: No.   New problem(s) identified: No, Describe:  none  New Short Term/Long Term Goal(s): medication stabilization, elimination of SI thoughts, development of comprehensive mental wellness plan.   Patient Goals:  "Stay at peace"  Discharge Plan or Barriers: Patient is to return home at discharge and is to follow up with community provider for continuum of care.  Reason for Continuation of Hospitalization: Medication stabilization  Estimated Length of Stay: Adequate for discharge  Attendees: Patient: Jeremiah Roberts 10/23/2020    Physician: Pricilla Larsson, MD 10/23/2020  Nursing:  10/23/2020   RN Care Manager:  10/23/2020   Social Worker: Ruthann Cancer, LCSW 10/23/2020  Recreational Therapist:  10/23/2020   Other:  10/23/2020   Other:  10/23/2020  Other: 10/23/2020      Scribe for Treatment Team: Otelia Santee, LCSW 10/23/2020 12:19 PM

## 2020-10-23 NOTE — [facility] Suicide Risk Assessment (Signed)
[facility] INPATIENT:  Family/Significant Other Suicide Prevention Education  Suicide Prevention Education:  Education Completed; Chapman Fitch [phone]), wife, has been identified by the patient as the family member/significant other with whom the patient will be residing, and identified as the person(s) who will aid the patient in the event of a mental health crisis (suicidal ideations/suicide attempt).  With written consent from the patient, the family member/significant other has been provided the following suicide prevention education, prior to the and/or following the discharge of the patient.  The suicide prevention education provided includes the following:  Suicide risk factors  Suicide prevention and interventions  National Suicide Hotline telephone number  [facility] assessment telephone number  [facility] Emergency Assistance 911  [facility] and/or Residential Mobile Crisis Unit telephone number  Request made of family/significant other to:  Remove weapons (e.g., guns, rifles, knives), all items previously/currently identified as safety concern.    Remove drugs/medications (over-the-counter, prescriptions, illicit drugs), all items previously/currently identified as a safety concern.  CSW completed SPE with Pt's wife, Chapman Fitch. She has been identified has a healthy support person. In addition she will be providing transportation at discharge. She has no concerns about his safety nor does she have concerns about Pt being a danger to himself or others. CSW provided update on Pt's mental status as well as discharge planning services. She confirmed that Pt does not have access to weapons. In addition, Pt has been provided with a copy of SPI pamphlet to include telephone number for crisis line.   The family member/significant other verbalizes understanding of the suicide prevention education information provided. The family member/significant other agrees  to remove the items of safety concern listed above.  Joelyn Oms Kamaron Deskins,LCSW 10/23/2020, 9:20 AM

## 2020-11-20 ENCOUNTER — Other Ambulatory Visit: Payer: Self-pay

## 2020-11-20 ENCOUNTER — Telehealth (INDEPENDENT_AMBULATORY_CARE_PROVIDER_SITE_OTHER): Payer: No Payment, Other | Admitting: Physician Assistant

## 2020-11-20 DIAGNOSIS — F411 Generalized anxiety disorder: Secondary | ICD-10-CM | POA: Diagnosis not present

## 2020-11-20 DIAGNOSIS — K219 Gastro-esophageal reflux disease without esophagitis: Secondary | ICD-10-CM

## 2020-11-20 DIAGNOSIS — F410 Panic disorder [episodic paroxysmal anxiety] without agoraphobia: Secondary | ICD-10-CM | POA: Insufficient documentation

## 2020-11-20 DIAGNOSIS — F321 Major depressive disorder, single episode, moderate: Secondary | ICD-10-CM

## 2020-11-20 MED ORDER — FAMOTIDINE 40 MG PO TABS: 40.0000 mg | ORAL_TABLET | Freq: Two times a day (BID) | ORAL | 0 refills | Status: DC

## 2020-11-20 MED ORDER — MIRTAZAPINE 15 MG PO TABS: 15.0000 mg | ORAL_TABLET | Freq: Every day | ORAL | 1 refills | Status: DC

## 2020-11-20 MED ORDER — HYDROXYZINE HCL 25 MG PO TABS: 25.0000 mg | ORAL_TABLET | Freq: Three times a day (TID) | ORAL | 1 refills | Status: DC | PRN

## 2020-11-20 NOTE — Progress Notes (Signed)
Psychiatric Initial Adult Assessment  Virtual Visit via Telephone Note  I connected with Jeremiah Roberts Admire on 11/20/20 at  8:30 AM EST by telephone and verified that I am speaking with the correct person using two identifiers.  Location: Patient: Home Provider: Clinic   I discussed the limitations, risks, security and privacy concerns of performing an evaluation and management service by telephone and the availability of in person appointments. I also discussed with the patient that there may be a patient responsible charge related to this service. The patient expressed understanding and agreed to proceed.  Follow Up Instructions:  I discussed the assessment and treatment plan with the patient. The patient was provided an opportunity to ask questions and all were answered. The patient agreed with the plan and demonstrated an understanding of the instructions.   The patient was advised to call back or seek an in-person evaluation if the symptoms worsen or if the condition fails to improve as anticipated.  I provided 44 minutes of non-face-to-face time during this encounter.   Meta HatchetUchenna E Mahayla Haddaway, PA   Patient Identification: Jeremiah MareDavid Holzhauer MRN:  [id number] Date of Evaluation:  11/20/2020 [facility] Chief Complaint:  Medication Managment Visit Diagnosis: No diagnosis found.  History of Present Illness:  Jeremiah MareDavid Brownstein is a 29 year old male with a past psychiatric history significant for depression and anxiety who [facility] via virtual telephone visit for medication management.  Patient reports that he had to reschedule his appointment due to running low on his medications.  Although running low on medications, patient reports that his depression and anxiety has been managed much better.  Patient also endorses that he has more control for his surroundings.  [facility] due to "bad anxiety and depression/paranoia."  During his stay he was seen by Dr. Jola Babinskilary and was placed on the following medications:  Hydroxyzine 25 mg at night Remeron 15 mg at night  Patient states that he is doing amazingly well and feels happier.  He states that he does not feel as miserable or useless and that his medications have lifted him up a lot since taking them.  Patient reports no issues or concerns with his current regimen of medications and is requesting refills.  Patient also inquires about getting a prescription for famotodine since he is not set up with a primary care provider and is currently waiting on his insurance to be set up.  Patient was informed that writer would fill a prescription for his famotidine for now but would not be able to do so in the future since it is not a psychiatric medication.  Patient expresses understanding of the situation.  Patient endorses some anxiety which he rates a 2 or 3 out of 10.  He states that the medications have helped a lot. Patient denies any major depressive symptoms except for lack of energy. 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  He reports being very social and likes to talk with people.  Patient reports having allergies to the medication Zoloft.  Patient states that he and his wife are currently living with her parents.  He states they are very welcoming and has no issues with them.  Patient does endorse an issue of not having custody of his child from a previous marriage.  He states that he is only able to see his child 3 times throughout the entire year and it  is a bit difficult for him. Patient states that he plans on trying to rebuild himself and conquer any obstacles in his way.  Patient exhibits a positive disposition and brighter outlook on life.  Patient denies active suicidal and homicidal ideations.  He further denies auditory or visual hallucinations.  Patient endorses plenty of sleep and  sometimes receives up to 12 hours of sleep.  Patient reports feeling well rested upon waking.  Patient endorses appetite but does not have a routine eating schedule.  He reports eating throughout the day whenever he can.  Patient endorses drinking alcohol sparingly/on rare occasions.  He reports vaping on and off especially when anxious.  He denies illicit drug use but has used CBD in the past when overworked or feeling anxious.  Associated Signs/Symptoms: Depression Symptoms:  psychomotor agitation, fatigue, anxiety, loss of energy/fatigue, (Hypo) Manic Symptoms:  Elevated Mood, Anxiety Symptoms:  Excessive Worry, Social Anxiety, Psychotic Symptoms:  N/A PTSD Symptoms: Had a traumatic exposure:  Patient reports that his father was an alcoholic  Past Psychiatric History: Anxiety Major depressive disorder  Previous Psychotropic Medications: No   Substance Abuse History in the last 12 months:  No.  Patient endorses using CBD oil  Consequences of Substance Abuse: NA  Past Medical History:  Past Medical History:  Diagnosis Date  . Acid reflux    No past surgical history on file.  Family Psychiatric History: Father - Alcoholic Younger Cousin - Schizophrenia, patient reports that his cousin was a "drug baby" who had some mental problems.  Family History: No family history on file.  Social History:   Social History   Socioeconomic History  . Marital status: Married    Spouse name: Not on file  . Number of children: Not on file  . Years of education: Not on file  . Highest education level: Not on file  Occupational History  . Not on file  Tobacco Use  . Smoking status: Former Smoker    Types: Cigars  . Smokeless tobacco: Never Used  . Tobacco comment: repotrs quit cold [place] beginning of Oct 2021  Vaping Use  . Vaping Use: Never used  Substance and Sexual Activity  . Alcohol use: Never    Comment: occasionally  . Drug use: Never  . Sexual activity: Not on file   Other Topics Concern  . Not on file  Social History Narrative  . Not on file   Social Determinants of Health   Financial Resource Strain:   . Difficulty of Paying Living Expenses: Not on file  Food Insecurity:   . Worried About Programme researcher, broadcasting/film/video in the Last Year: Not on file  . Ran Out of Food in the Last Year: Not on file  Transportation Needs:   . Lack of Transportation (Medical): Not on file  . Lack of Transportation (Non-Medical): Not on file  Physical Activity:   . Days of Exercise per Week: Not on file  . Minutes of Exercise per Session: Not on file  Stress:   . Feeling of Stress : Not on file  Social Connections:   . Frequency of Communication with Friends and Family: Not on file  . Frequency of Social Gatherings with Friends and Family: Not on file  . Attends Religious Services: Not on file  . Active Member of Clubs or Organizations: Not on file  . Attends Banker Meetings: Not on file  . Marital Status: Not on file    Additional Social History: Patient is currently  working at the [employer] Per chart review, patient has a 96-year-old daughter who lives in [place] with her mother but is only able to see her 3 weeks in a year.  Allergies:   Allergies  Allergen Reactions  . Sertraline Hcl Diarrhea and Nausea And Vomiting    Metabolic Disorder Labs: No results found for: HGBA1C, MPG No results found for: PROLACTIN No results found for: CHOL, TRIG, HDL, CHOLHDL, VLDL, LDLCALC Lab Results  Component Value Date   TSH 1.270 10/22/2020    Therapeutic Level Labs: No results found for: LITHIUM No results found for: CBMZ No results found for: VALPROATE  Current Medications: Current Outpatient Medications  Medication Sig Dispense Refill  . famotidine (PEPCID) 40 MG tablet Take 1 tablet (40 mg total) by mouth 2 (two) times daily. 60 tablet 0  . hydrOXYzine (ATARAX/VISTARIL) 25 MG tablet Take 1 tablet (25 mg total) by mouth 3 (three)  times daily as needed for anxiety. 30 tablet 0  . mirtazapine (REMERON) 15 MG tablet Take 1 tablet (15 mg total) by mouth at bedtime. 30 tablet 0  . ondansetron (ZOFRAN ODT) 4 MG disintegrating tablet Take 1 tablet (4 mg total) by mouth every 8 (eight) hours as needed for nausea or vomiting. 20 tablet 0   No current facility-administered medications for this visit.    Musculoskeletal: Strength & Muscle Tone: Unable to assess due to telemedicine visit Gait & Station: Unable to assess due to telemedicine visit Patient leans: Unable to assess due to telemedicine visit  Psychiatric Specialty Exam: Review of Systems  Psychiatric/Behavioral: Negative for decreased concentration, dysphoric mood, hallucinations, sleep disturbance and suicidal ideas. The patient is nervous/anxious. The patient is not hyperactive.     There were no vitals taken for this visit.There is no height or weight on file to calculate BMI.  General Appearance: Unable to assess due to telemedicine visit  Eye Contact:  Unable to assess due to telemedicine visit  Speech:  Clear and Coherent and Normal Rate  Volume:  Normal  Mood:  Euthymic  Affect:  Appropriate  Thought Process:  Coherent, Goal Directed and Descriptions of Associations: Intact  Orientation:  Full (Time, Place, and Person)  Thought Content:  WDL and Logical  Suicidal Thoughts:  No  Homicidal Thoughts:  No  Memory:  Immediate;   Good Recent;   Good Remote;   Good  Judgement:  Good  Insight:  Good  Psychomotor Activity:  Normal  Concentration:  Concentration: Good and Attention Span: Good  Recall:  Good  Fund of Knowledge:Good  Language: Good  Akathisia:  NA  Handed:  Right  AIMS (if indicated):  not done  Assets:  Communication Skills Desire for Improvement Housing Intimacy Social Support Vocational/Educational  ADL's:  Intact  Cognition: WNL  Sleep:  Good   Screenings: AIMS     [facility]  AIMS Total Score 0    AUDIT     [facility]  Alcohol Use Disorder Identification Test Final Score (AUDIT) 0      Assessment and Plan:  Jamee Pacholski is a 29 year old male with a past psychiatric history significant anxiety and major depressive disorder who presents to [facility] via virtual telephone visit for medication management.  Patient was recently admitted to [facility] on 10/21/2020 for anxiety, depression, and paranoia.  He was discharged on the following  medications: Hydroxyzine 25 mg and Remeron 15 mg.  Patient states that the medications have been helpful and that he is doing well and is much happier.  Patient is requesting refills on his medications. Patient's medications will be e-prescribed to pharmacy of choice.  Patient is also requesting a a prescription refill for his famotidine.  Patient is not currently set up with a primary care provider and has run out of his famotidine which he finds really helpful in the management of his GERD. Patient was informed that writer would fill a prescription for his famotidine for now but would not be able to do so in the future since it is not a psychiatric medication.  Patient expresses understanding of the explanation.  1. Gastroesophageal reflux disease, unspecified whether esophagitis present  - famotidine (PEPCID) 40 MG tablet; Take 1 tablet (40 mg total) by mouth 2 (two) times daily.  Dispense: 60 tablet; Refill: 0  2. Anxiety state  - hydrOXYzine (ATARAX/VISTARIL) 25 MG tablet; Take 1 tablet (25 mg total) by mouth 3 (three) times daily as needed for anxiety.  Dispense: 60 tablet; Refill: 1 - mirtazapine (REMERON) 15 MG tablet; Take 1 tablet (15 mg total) by mouth at bedtime.  Dispense: 30 tablet; Refill: 1  3. MDD (major depressive disorder), single episode, moderate  (HCC)  - mirtazapine (REMERON) 15 MG tablet; Take 1 tablet (15 mg total) by mouth at bedtime.  Dispense: 30 tablet; Refill: 1  Patient to follow up in 5 weeks.  Meta Hatchet, PA 12/1/202111:51 AM

## 2020-12-30 ENCOUNTER — Encounter (HOSPITAL_COMMUNITY): Payer: Self-pay | Admitting: Physician Assistant

## 2020-12-31 ENCOUNTER — Other Ambulatory Visit: Payer: Self-pay

## 2020-12-31 ENCOUNTER — Encounter (HOSPITAL_COMMUNITY): Payer: Self-pay | Admitting: Physician Assistant

## 2020-12-31 ENCOUNTER — Ambulatory Visit (INDEPENDENT_AMBULATORY_CARE_PROVIDER_SITE_OTHER): Payer: No Payment, Other | Admitting: Physician Assistant

## 2020-12-31 DIAGNOSIS — K219 Gastro-esophageal reflux disease without esophagitis: Secondary | ICD-10-CM | POA: Diagnosis not present

## 2020-12-31 DIAGNOSIS — F411 Generalized anxiety disorder: Secondary | ICD-10-CM | POA: Diagnosis not present

## 2020-12-31 DIAGNOSIS — F321 Major depressive disorder, single episode, moderate: Secondary | ICD-10-CM | POA: Diagnosis not present

## 2020-12-31 MED ORDER — BUSPIRONE HCL 5 MG PO TABS: 5.0000 mg | ORAL_TABLET | Freq: Two times a day (BID) | ORAL | 1 refills | Status: DC

## 2020-12-31 MED ORDER — MIRTAZAPINE 15 MG PO TABS: 15.0000 mg | ORAL_TABLET | Freq: Every day | ORAL | 1 refills | Status: DC

## 2020-12-31 MED ORDER — FAMOTIDINE 40 MG PO TABS: 40.0000 mg | ORAL_TABLET | Freq: Two times a day (BID) | ORAL | 0 refills | Status: AC

## 2020-12-31 NOTE — Progress Notes (Signed)
BH MD/PA/NP OP Progress Note  12/31/2020 10:23 AM Jeremiah Roberts  MRN:  [id number]  Chief Complaint: Follow-up/medication management  HPI:  Jeremiah Roberts is a 30 year old male with a past psychiatric history significant for major depressive disorder and anxiety who presents to [facility] for follow-up and medication management.  Patient is currently being managed on the following medications:  Remeron 15 mg at bedtime Hydroxyzine 25 mg 3 times daily  Patient notes that the medications are improving his stress to a manageable level. He reports that the stress has not gone all the way but he does feel like there is no weight on his shoulders.  Patient reports that the hydroxyzine makes him feel groggy after taking it, therefore he only takes the hydroxyzine if his anxiety is really high or at night. Patient notes the following stressors: No place to live, truck almost getting repoed, and his financial debt.  Patient denies any issues with taking his Remeron and has found it really helpful in the management of his anxiety and depression.  Patient reports that whenever he misses a dose of his Remeron he often feels more anxious, paranoid, and has decreased appetite.  Patient is interested in taking a medication in the morning that will manage his anxiety.  Today patient is well groomed, pleasant, calm, engaged in conversation, maintains eye contact and fidgeting throughout evaluation. Patient endorses good mood and that he is a lot better than he was before being placed on medications.  He denies suicidal and homicidal ideations.  He further denies auditory and visual hallucinations.  Patient endorses hypersomnia and receives up to 12 hours of sleep a night.  Patient expresses that he has no concerns with the amount he sleep he gets and attributes it to being on his feet all day at work.  Patient endorses good appetite and eats on average 3 meals a day.  Patient  endorses alcohol consumption and drinks 2 beers per week.  Patient reports tobacco use and smokes 1/2 to 3/4 pack of cigarettes a day.  Patient denies illicit drug use.  Visit Diagnosis: No diagnosis found.  Past Psychiatric History:  Anxiety Major Depressive Disorder  Past Medical History:  Past Medical History:  Diagnosis Date  . Acid reflux    No past surgical history on file.  Family Psychiatric History: Father - Alcoholic Younger Cousin - Schizophrenia, patient reports that his cousin was a "drug baby" who had some mental problems.   Family History: No family history on file.  Social History:  Social History   Socioeconomic History  . Marital status: Married    Spouse name: Not on file  . Number of children: Not on file  . Years of education: Not on file  . Highest education level: Not on file  Occupational History  . Not on file  Tobacco Use  . Smoking status: Former Smoker    Types: Cigars  . Smokeless tobacco: Never Used  . Tobacco comment: repotrs quit cold [place] beginning of Oct 2021  Vaping Use  . Vaping Use: Never used  Substance and Sexual Activity  . Alcohol use: Never    Comment: occasionally  . Drug use: Never  . Sexual activity: Not on file  Other Topics Concern  . Not on file  Social History Narrative  . Not on file   Social Determinants of Health   Financial Resource Strain: Not on file  Food Insecurity: Not on file  Transportation Needs: Not on file  Physical Activity: Not on file  Stress: Not on file  Social Connections: Not on file    Allergies:  Allergies  Allergen Reactions  . Sertraline Hcl Diarrhea and Nausea And Vomiting    Metabolic Disorder Labs: No results found for: HGBA1C, MPG No results found for: PROLACTIN No results found for: CHOL, TRIG, HDL, CHOLHDL, VLDL, LDLCALC Lab Results  Component Value Date   TSH 1.270 10/22/2020    Therapeutic Level Labs: No results found for: LITHIUM No results found for:  VALPROATE No components found for:  CBMZ  Current Medications: Current Outpatient Medications  Medication Sig Dispense Refill  . famotidine (PEPCID) 40 MG tablet Take 1 tablet (40 mg total) by mouth 2 (two) times daily. 60 tablet 0  . hydrOXYzine (ATARAX/VISTARIL) 25 MG tablet Take 1 tablet (25 mg total) by mouth 3 (three) times daily as needed for anxiety. 60 tablet 1  . mirtazapine (REMERON) 15 MG tablet Take 1 tablet (15 mg total) by mouth at bedtime. 30 tablet 1  . ondansetron (ZOFRAN ODT) 4 MG disintegrating tablet Take 1 tablet (4 mg total) by mouth every 8 (eight) hours as needed for nausea or vomiting. 20 tablet 0   No current facility-administered medications for this visit.     Musculoskeletal: Strength & Muscle Tone: within normal limits Gait & Station: normal Patient leans: N/A  Psychiatric Specialty Exam: Review of Systems  Psychiatric/Behavioral: Negative for decreased concentration, dysphoric mood, hallucinations, sleep disturbance and suicidal ideas. The patient is nervous/anxious. The patient is not hyperactive.     There were no vitals taken for this visit.There is no height or weight on file to calculate BMI.  General Appearance: Fairly Groomed and Neat  Eye Contact:  Good  Speech:  Clear and Coherent and Normal Rate  Volume:  Normal  Mood:  Anxious and Euthymic  Affect:  Appropriate  Thought Process:  Coherent, Goal Directed and Descriptions of Associations: Intact  Orientation:  Full (Time, Place, and Person)  Thought Content: WDL and Logical   Suicidal Thoughts:  No  Homicidal Thoughts:  No  Memory:  Immediate;   Good Recent;   Good Remote;   Good  Judgement:  Good  Insight:  Good  Psychomotor Activity:  Normal and Restlessness  Concentration:  Concentration: Good and Attention Span: Good  Recall:  Good  Fund of Knowledge: Good  Language: Good  Akathisia:  NA  Handed:  Right  AIMS (if indicated): not done  Assets:  Communication Skills Desire  for Improvement Intimacy Social Support Vocational/Educational  ADL's:  Intact  Cognition: WNL  Sleep:  Good   Screenings: AIMS   Flowsheet Row [facility]  AIMS Total Score 0    AUDIT   Flowsheet Row [facility]  Alcohol Use Disorder Identification Test Final Score (AUDIT) 0       Assessment and Plan:  Jeremiah Roberts is a 30 year old male with a past psychiatric history significant for major depressive disorder and anxiety who presents to [facility] for follow-up and medication management.  Patient is currently being managed on the following medications: Remeron 15 mg and Hydroxyzine 25 mg.  Patient notes that his current medication regimen has been managing his anxiety at a tolerable level.  Patient notices improvement in the following symptoms when taking Remeron: Anxiety, paranoia, and decreased appetite.  Patient would like to take a medication in the  morning to help with further managing his anxiety.  Patient was recommended buspirone 5 mg 2 times daily for the management of anxiety.  Patient was informed that, much like with most medications, buspirone also has a side effect profile and that it would take a couple of weeks before improvements in anxiety would be seen.  Patient expresses understanding with information being communicated to him today and is agreeable to recommendation.  Patient's medications will be e-prescribed to pharmacy of choice.  Before closing the encounter, patient expressed that he is about to run out of his current prescription of famotidine.  He reports that he has turned in all his information and paperwork to be eligible for insurance and is currently waiting for a confirmation.  Patient is not yet set up with a primary care provider and would like another prescription  filled for famotidine so that he does not run out before he is able to be set up with a primary care provider.  Writer will prescribe a 30-day supply for famotidine for the management of GERD following the conclusion of the encounter.  1. Anxiety state  - mirtazapine (REMERON) 15 MG tablet; Take 1 tablet (15 mg total) by mouth at bedtime.  Dispense: 30 tablet; Refill: 1 - busPIRone (BUSPAR) 5 MG tablet; Take 1 tablet (5 mg total) by mouth in the morning and at bedtime.  Dispense: 60 tablet; Refill: 1  2. MDD (major depressive disorder), single episode, moderate (HCC)  - mirtazapine (REMERON) 15 MG tablet; Take 1 tablet (15 mg total) by mouth at bedtime.  Dispense: 30 tablet; Refill: 1  3. Gastroesophageal reflux disease, unspecified whether esophagitis present  - famotidine (PEPCID) 40 MG tablet; Take 1 tablet (40 mg total) by mouth 2 (two) times daily.  Dispense: 60 tablet; Refill: 0  Patient to follow-up in 6 weeks  Meta Hatchet, PA 12/31/2020, 10:23 AM

## 2021-01-01 ENCOUNTER — Telehealth (HOSPITAL_COMMUNITY): Payer: No Payment, Other | Admitting: Physician Assistant

## 2021-02-11 ENCOUNTER — Ambulatory Visit (INDEPENDENT_AMBULATORY_CARE_PROVIDER_SITE_OTHER): Payer: No Payment, Other | Admitting: Physician Assistant

## 2021-02-11 ENCOUNTER — Encounter (HOSPITAL_COMMUNITY): Payer: Self-pay | Admitting: Physician Assistant

## 2021-02-11 ENCOUNTER — Other Ambulatory Visit: Payer: Self-pay

## 2021-02-11 VITALS — BP 104/78 | HR 77 | Ht 73.0 in | Wt 187.6 lb

## 2021-02-11 DIAGNOSIS — F330 Major depressive disorder, recurrent, mild: Secondary | ICD-10-CM | POA: Diagnosis not present

## 2021-02-11 DIAGNOSIS — F411 Generalized anxiety disorder: Secondary | ICD-10-CM | POA: Diagnosis not present

## 2021-02-11 NOTE — Progress Notes (Signed)
BH MD/PA/NP OP Progress Note  02/11/2021 9:35 AM [name]  MRN:  [id number]  Chief Complaint:  Chief Complaint    Follow-up     HPI:   Jeremiah Roberts is a 30 year old male with a past psychiatric history significant for major depressive disorder and anxiety who presents to [facility] for follow-up and medication management.  Patient is currently being managed on the following medications:  Remeron 15 mg at bedtime Hydroxyzine 25 mg 3 times daily as needed Buspirone 5 mg 2 times daily  Patient reports that his medications are working perfectly.  Patient does endorse having a little trouble with depression due to the many stressors going on within his life.  Patient's stressors include financial hardships, minimal credit, and being behind on his truck payments.  Patient states that he had a better handle on his finances before the COVID pandemic.  When the COVID pandemic struck, patient states that he lost his job and started experiencing his current financial troubles.  Patient states that he has not started his buspirone medications due to having a decent handle on his anxiety.  Patient has been engaging in meditation and purifying oils as a means to manage his anxiety.  Patient is pleasant, calm, cooperative, and fully engaged in conversation during the duration of the encounter.  Patient expresses that he is currently tired due to having to make his early appointment for today.  Besides feeling tired, patient reports that he has been in a good mood.  Patient denies suicidal ideations.  He does endorse feelings of harm towards a coworker/contractor due to his disrespectful behavior towards his boss.  Patient states that he would never act on these thoughts.  Patient denies auditory or visual hallucinations.  Patient endorses good sleep and states that he receives on average 8 to 10 hours of sleep each night.  He expresses that Remeron has been especially  helpful in the management of his sleep.  He endorses good appetite and eats on average 3 meals per day with some snacking in between.  He endorses alcohol consumption sparingly and has roughly 2 beers 2 times per week.  Patient endorses tobacco use and smokes 1/4 pack a day.  Patient endorses illicit drug use in the form of marijuana occasionally.  Visit Diagnosis:    ICD-10-CM   1. Anxiety state  F41.1   2. Mild episode of recurrent major depressive disorder (HCC)  F33.0     Past Psychiatric History: Major depressive disorder Anxiety  Past Medical History:  Past Medical History:  Diagnosis Date  . Acid reflux    History reviewed. No pertinent surgical history.  Family Psychiatric History: Father - Alcoholic Younger Cousin - Schizophrenia, patient reports that his cousin was a "drug" baby who had some mental problems  Family History: History reviewed. No pertinent family history.  Social History:  Social History   Socioeconomic History  . Marital status: Married    Spouse name: Not on file  . Number of children: Not on file  . Years of education: Not on file  . Highest education level: Not on file  Occupational History  . Not on file  Tobacco Use  . Smoking status: Former Smoker    Types: Cigars  . Smokeless tobacco: Never Used  . Tobacco comment: repotrs quit cold [place] beginning of Oct 2021  Vaping Use  . Vaping Use: Never used  Substance and Sexual Activity  . Alcohol use: Never    Comment: occasionally  .  Drug use: Never  . Sexual activity: Not on file  Other Topics Concern  . Not on file  Social History Narrative  . Not on file   Social Determinants of Health   Financial Resource Strain: Not on file  Food Insecurity: Not on file  Transportation Needs: Not on file  Physical Activity: Not on file  Stress: Not on file  Social Connections: Not on file    Allergies:  Allergies  Allergen Reactions  . Sertraline Hcl Diarrhea and Nausea And Vomiting     Metabolic Disorder Labs: No results found for: HGBA1C, MPG No results found for: PROLACTIN No results found for: CHOL, TRIG, HDL, CHOLHDL, VLDL, LDLCALC Lab Results  Component Value Date   TSH 1.270 10/22/2020    Therapeutic Level Labs: No results found for: LITHIUM No results found for: VALPROATE No components found for:  CBMZ  Current Medications: Current Outpatient Medications  Medication Sig Dispense Refill  . busPIRone (BUSPAR) 5 MG tablet Take 1 tablet (5 mg total) by mouth in the morning and at bedtime. 60 tablet 1  . famotidine (PEPCID) 40 MG tablet Take 1 tablet (40 mg total) by mouth 2 (two) times daily. 60 tablet 0  . hydrOXYzine (ATARAX/VISTARIL) 25 MG tablet Take 1 tablet (25 mg total) by mouth 3 (three) times daily as needed for anxiety. 60 tablet 1  . ondansetron (ZOFRAN ODT) 4 MG disintegrating tablet Take 1 tablet (4 mg total) by mouth every 8 (eight) hours as needed for nausea or vomiting. 20 tablet 0  . mirtazapine (REMERON) 15 MG tablet Take 1 tablet (15 mg total) by mouth at bedtime. 30 tablet 1   No current facility-administered medications for this visit.     Musculoskeletal: Strength & Muscle Tone: within normal limits Gait & Station: normal Patient leans: N/A  Psychiatric Specialty Exam: Review of Systems  Psychiatric/Behavioral: Negative for decreased concentration, dysphoric mood, hallucinations, self-injury, sleep disturbance and suicidal ideas. The patient is not nervous/anxious and is not hyperactive.     Blood pressure 104/78, pulse 77, height 6\' 1"  (1.854 m), weight 187 lb 9.6 oz (85.1 kg), SpO2 99 %.Body mass index is 24.75 kg/m.  General Appearance: Fairly Groomed and Neat  Eye Contact:  Good  Speech:  Clear and Coherent  Volume:  Normal  Mood:  Euthymic  Affect:  Appropriate  Thought Process:  Coherent and Descriptions of Associations: Intact  Orientation:  Full (Time, Place, and Person)  Thought Content: WDL   Suicidal Thoughts:   No  Homicidal Thoughts:  Yes.  without intent/plan  Memory:  Immediate;   Good Recent;   Good Remote;   Good  Judgement:  Good  Insight:  Good  Psychomotor Activity:  Normal and Restlessness  Concentration:  Concentration: Good and Attention Span: Good  Recall:  Good  Fund of Knowledge: Good  Language: Good  Akathisia:  NA  Handed:  Right  AIMS (if indicated): not done  Assets:  Communication Skills Desire for Improvement Intimacy Social Support Vocational/Educational  ADL's:  Intact  Cognition: WNL  Sleep:  Good   Screenings: AIMS   Flowsheet Row [facility]  AIMS Total Score 0    AUDIT   Flowsheet Row [facility]  Alcohol Use Disorder Identification Test Final Score (AUDIT) 0    GAD-7   Flowsheet Row Office Visit from 02/11/2021 in [facility]  Total GAD-7  Score 4    PHQ2-9   Flowsheet Row Office Visit from 02/11/2021 in [facility]  PHQ-2 Total Score 1    Flowsheet Row Office Visit from 02/11/2021 in [facility] Most recent reading at 02/11/2021  8:42 AM [facility] Most recent reading at 10/21/2020 10:00 PM ED from 10/21/2020 in [facility] Most recent reading at 10/21/2020  1:59 AM  C-SSRS RISK CATEGORY No Risk No Risk No Risk       Assessment and Plan:   Kaliel Bolds is a 30 year old male with a past psychiatric history significant for major depressive disorder and anxiety who presents to [facility] for follow-up and medication management.  Patient endorses experiencing some depressive episodes due to the many stressors in his life but states that overall his medications have been helpful  in the management of his depression and anxiety.  Patient has not started buspirone and states that he has been managing his anxiety through meditation and using purifying oils.  Patient would like to start buspirone in the near future.  Patient denies issues or concerns with his current medication regimen and does not need any medication refills at this time.  Patient was encouraged to start his buspirone for the management of anxiety.  Patient was agreeable to recommendation.  1. Anxiety state Patient to continue taking Remeron 15 mg at bedtime for the management of his anxiety Patient to continue taking hydroxyzine 25 mg 3 times daily as needed for the management of his anxiety Patient was encouraged to start buspirone for the management of his anxiety  2. Mild episode of recurrent major depressive disorder [facility]) Patient to continue taking Remeron 15 mg at bedtime for the management of his depression  Patient to follow up in 6 weeks  Meta Hatchet, PA 02/11/2021, 9:35 AM

## 2021-03-26 ENCOUNTER — Encounter (HOSPITAL_COMMUNITY): Payer: Self-pay

## 2021-03-26 ENCOUNTER — Telehealth (HOSPITAL_COMMUNITY): Payer: No Payment, Other | Admitting: Physician Assistant

## 2021-03-26 ENCOUNTER — Telehealth (INDEPENDENT_AMBULATORY_CARE_PROVIDER_SITE_OTHER): Payer: No Payment, Other | Admitting: Family

## 2021-03-26 ENCOUNTER — Other Ambulatory Visit: Payer: Self-pay

## 2021-03-26 DIAGNOSIS — F321 Major depressive disorder, single episode, moderate: Secondary | ICD-10-CM

## 2021-03-26 DIAGNOSIS — F411 Generalized anxiety disorder: Secondary | ICD-10-CM

## 2021-03-26 NOTE — Progress Notes (Signed)
NP attempted to reach patient via [facility] was a subsequent follow-up phone call. Jeremiah Roberts reported his follow-up appt is normally at the end of the month around April 20th.  Stated he is unaware of this appointment. Stated he is currently working and Wednesdays usually are the busiest days for him.  Reports he will follow-up with office to reschedule.

## 2021-05-07 ENCOUNTER — Other Ambulatory Visit: Payer: Self-pay

## 2021-05-07 ENCOUNTER — Telehealth (INDEPENDENT_AMBULATORY_CARE_PROVIDER_SITE_OTHER): Payer: No Payment, Other | Admitting: Physician Assistant

## 2021-05-07 ENCOUNTER — Encounter (HOSPITAL_COMMUNITY): Payer: Self-pay | Admitting: Physician Assistant

## 2021-05-07 DIAGNOSIS — F321 Major depressive disorder, single episode, moderate: Secondary | ICD-10-CM | POA: Diagnosis not present

## 2021-05-07 DIAGNOSIS — F411 Generalized anxiety disorder: Secondary | ICD-10-CM | POA: Diagnosis not present

## 2021-05-07 MED ORDER — HYDROXYZINE HCL 25 MG PO TABS: 25.0000 mg | ORAL_TABLET | Freq: Three times a day (TID) | ORAL | 1 refills | Status: DC | PRN

## 2021-05-07 MED ORDER — MIRTAZAPINE 15 MG PO TABS
15.0000 mg | ORAL_TABLET | Freq: Every day | ORAL | 1 refills | Status: DC
Start: 2021-05-07 — End: 2021-06-22

## 2021-05-07 NOTE — Progress Notes (Signed)
BH MD/PA/NP OP Progress Note  Virtual Visit via Telephone Note  I connected with Jeremiah Mareavid Roberts on 05/07/21 at  3:00 PM EDT by telephone and verified that I am speaking with the correct person using two identifiers.  Location: Patient: Home Provider: Clinic   I discussed the limitations, risks, security and privacy concerns of performing an evaluation and management service by telephone and the availability of in person appointments. I also discussed with the patient that there may be a patient responsible charge related to this service. The patient expressed understanding and agreed to proceed.  Follow Up Instructions:  I discussed the assessment and treatment plan with the patient. The patient was provided an opportunity to ask questions and all were answered. The patient agreed with the plan and demonstrated an understanding of the instructions.   The patient was advised to call back or seek an in-person evaluation if the symptoms worsen or if the condition fails to improve as anticipated.  I provided 20 minutes of non-face-to-face time during this encounter.   Meta HatchetUchenna E Adarius Tigges, PA    05/07/2021 11:18 AM Jeremiah Mareavid Roberts  MRN:  [id number]  Chief Complaint: Follow up and medication management  HPI:   Jeremiah MareDavid Roberts is a 30 year old male with a past psychiatric history significant for major depressive disorder and anxiety who [facility] via virtual telephone visit for follow-up and medication management.  Patient is currently being managed on the following medications:  Remeron 15 mg at bedtime Hydroxyzine 25 mg 3 times daily as needed   Patient reports no issues or concerns regarding his current medication regimen.  Patient denies a need for dosage adjustments at this time and is requesting refills on all his medications.  Patient was suggested taking buspirone 5 mg 2 times daily for the management of his anxiety during the last  encounter.  Patient reports that he feels like his anxiety is being well managed on his current medications consisting of escitalopram and hydroxyzine.  A GAD-7 screen was performed with the patient scoring a 2.  Patient reports that he recently gave up his truck and exchanged it for a nicer car which in turn, saved him a lot of money.  He reports that his living situation is okay and has not changed.  Patient reports that his wife's mother suffers from seizures and he tends to distance himself away from his girlfriend's mother to avoid witnessing an potential injuries from her seizures.  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  Patient is pleasant, calm, cooperative, and fully engaged in conversation during the encounter.  Patient reports that he is in a great mood and was able to get a lot of his long care activities completed and out of the way.  Patient denies suicidal ideations.  Patient states " I have small hatred towards ignorant ass people." Patient goes on to say " God is gonna get the best of them."  Patient denies acting on homicidal ideations.  Patient further denies auditory or visual hallucinations and does not appear to be responding to internal/external stimuli.  Patient endorses good sleep and receives on average 9 hours of sleep each night.  Patient reports that when he takes his hydroxyzine he may experience up to 12 hours of sleep.  Patient endorses normal appetite and states that he has been trying to cut down on his junk food intake.  Patient  endorses occasional alcohol consumption and states that he has roughly 2 beers 2 days of the week.  Patient also endorses tobacco use and states that he smokes a quarter of a pack of cigarettes per day.  Patient endorses illicit drug use in the form of marijuana.  Visit Diagnosis:    ICD-10-CM   1. Anxiety state  F41.1 hydrOXYzine (ATARAX/VISTARIL) 25 MG tablet     mirtazapine (REMERON) 15 MG tablet  2. MDD (major depressive disorder), single episode, moderate (HCC)  F32.1 mirtazapine (REMERON) 15 MG tablet    Past Psychiatric History:  Major depressive disorder Anxiety  Past Medical History:  Past Medical History:  Diagnosis Date  . Acid reflux    History reviewed. No pertinent surgical history.  Family Psychiatric History:  Father - Alcoholic Younger Cousin - Schizophrenia, patient reports that his cousin was a "drug" baby who had some mental problems  Family History: History reviewed. No pertinent family history.  Social History:  Social History   Socioeconomic History  . Marital status: Married    Spouse name: Not on file  . Number of children: Not on file  . Years of education: Not on file  . Highest education level: Not on file  Occupational History  . Not on file  Tobacco Use  . Smoking status: Former Smoker    Types: Cigars  . Smokeless tobacco: Never Used  . Tobacco comment: repotrs quit cold [place] beginning of Oct 2021  Vaping Use  . Vaping Use: Never used  Substance and Sexual Activity  . Alcohol use: Never    Comment: occasionally  . Drug use: Never  . Sexual activity: Not on file  Other Topics Concern  . Not on file  Social History Narrative  . Not on file   Social Determinants of Health   Financial Resource Strain: Not on file  Food Insecurity: Not on file  Transportation Needs: Not on file  Physical Activity: Not on file  Stress: Not on file  Social Connections: Not on file    Allergies:  Allergies  Allergen Reactions  . Sertraline Hcl Diarrhea and Nausea And Vomiting    Metabolic Disorder Labs: No results found for: HGBA1C, MPG No results found for: PROLACTIN No results found for: CHOL, TRIG, HDL, CHOLHDL, VLDL, LDLCALC Lab Results  Component Value Date   TSH 1.270 10/22/2020    Therapeutic Level Labs: No results found for: LITHIUM No results found for: VALPROATE No components  found for:  CBMZ  Current Medications: Current Outpatient Medications  Medication Sig Dispense Refill  . busPIRone (BUSPAR) 5 MG tablet Take 1 tablet (5 mg total) by mouth in the morning and at bedtime. 60 tablet 1  . famotidine (PEPCID) 40 MG tablet Take 1 tablet (40 mg total) by mouth 2 (two) times daily. 60 tablet 0  . hydrOXYzine (ATARAX/VISTARIL) 25 MG tablet Take 1 tablet (25 mg total) by mouth 3 (three) times daily as needed for anxiety. 60 tablet 1  . mirtazapine (REMERON) 15 MG tablet Take 1 tablet (15 mg total) by mouth at bedtime. 30 tablet 1  . ondansetron (ZOFRAN ODT) 4 MG disintegrating tablet Take 1 tablet (4 mg total) by mouth every 8 (eight) hours as needed for nausea or vomiting. 20 tablet 0   No current facility-administered medications for this visit.     Musculoskeletal: Strength & Muscle Tone: Unable to assess due to telemedicine visit Gait & Station: Unable to assess due to telemedicine visit Patient leans: Unable to assess due  to telemedicine visit  Psychiatric Specialty Exam: Review of Systems  Psychiatric/Behavioral: Negative for decreased concentration, dysphoric mood, hallucinations, self-injury, sleep disturbance and suicidal ideas. The patient is not nervous/anxious and is not hyperactive.     There were no vitals taken for this visit.There is no height or weight on file to calculate BMI.  General Appearance: Unable to assess due to telemedicine visit  Eye Contact:  Unable to assess due to telemedicine visit  Speech:  Clear and Coherent and Normal Rate  Volume:  Normal  Mood:  Euthymic  Affect:  Appropriate  Thought Process:  Coherent and Descriptions of Associations: Intact  Orientation:  Full (Time, Place, and Person)  Thought Content: WDL   Suicidal Thoughts:  No  Homicidal Thoughts:  Yes.  without intent/plan  Memory:  Immediate;   Good Recent;   Good Remote;   Good  Judgement:  Good  Insight:  Fair  Psychomotor Activity:  Normal   Concentration:  Concentration: Good and Attention Span: Good  Recall:  Good  Fund of Knowledge: Good  Language: Good  Akathisia:  NA  Handed:  Right  AIMS (if indicated): not done  Assets:  Communication Skills Desire for Improvement Intimacy Social Support Vocational/Educational  ADL's:  Intact  Cognition: WNL  Sleep:  Good   Screenings: AIMS   Flowsheet [facility]  AIMS Total Score 0    AUDIT   Flowsheet Row [facility]  Alcohol Use Disorder Identification Test Final Score (AUDIT) 0    GAD-7   Flowsheet Row Video Visit from 05/07/2021 in [facility] Office Visit from 02/11/2021 in [facility]  Total GAD-7 Score 2 4    PHQ2-9   Flowsheet Row Video Visit from 05/07/2021 in [facility] Office Visit from 02/11/2021 in [facility]  PHQ-2 Total Score 0 1    Flowsheet Row Video Visit from 05/07/2021 in [facility] Office Visit from 02/11/2021 in [facility]Discharged[facility]  C-SSRS RISK CATEGORY No Risk No Risk No Risk       Assessment and Plan:   Jeremiah Roberts is a 30 year old male with a past psychiatric history significant for major depressive disorder and anxiety who presents to [facility] via virtual telephone visit for follow-up and medication management.  Patient reports no issues or concerns regarding his current medication regimen.  Patient denies the need for dosage adjustments at this time and is requesting refills on all his medications.  Patient would like to hold off on using buspirone 5 mg 2 times daily due to his Remeron and hydroxyzine  effectively managing his anxiety.  Patient's medications to be e-prescribed to pharmacy of choice.  1. Anxiety state  - hydrOXYzine (ATARAX/VISTARIL) 25 MG tablet; Take 1 tablet (25 mg total) by mouth 3 (three) times daily as needed for anxiety.  Dispense: 60 tablet; Refill: 1 - mirtazapine (REMERON) 15 MG tablet; Take 1 tablet (15 mg total) by mouth at bedtime.  Dispense: 30 tablet; Refill: 1  2. MDD (major depressive disorder), single episode, moderate (HCC)  - mirtazapine (REMERON) 15 MG tablet; Take 1 tablet (15 mg total) by mouth at bedtime.  Dispense: 30 tablet; Refill: 1  Patient to follow-up in 6 weeks Jeremiah Roberts E Jerelene Salaam,  PA 05/07/2021, 11:18 AM

## 2021-06-17 ENCOUNTER — Telehealth (INDEPENDENT_AMBULATORY_CARE_PROVIDER_SITE_OTHER): Payer: No Payment, Other | Admitting: Physician Assistant

## 2021-06-17 DIAGNOSIS — F321 Major depressive disorder, single episode, moderate: Secondary | ICD-10-CM

## 2021-06-17 DIAGNOSIS — F411 Generalized anxiety disorder: Secondary | ICD-10-CM

## 2021-06-22 ENCOUNTER — Encounter (HOSPITAL_COMMUNITY): Payer: Self-pay | Admitting: Physician Assistant

## 2021-06-22 MED ORDER — HYDROXYZINE HCL 25 MG PO TABS
25.0000 mg | ORAL_TABLET | Freq: Three times a day (TID) | ORAL | 1 refills | Status: DC | PRN
Start: 2021-06-22 — End: 2021-08-19

## 2021-06-22 MED ORDER — MIRTAZAPINE 15 MG PO TABS
15.0000 mg | ORAL_TABLET | Freq: Every day | ORAL | 1 refills | Status: DC
Start: 2021-06-22 — End: 2021-08-19

## 2021-06-22 NOTE — Progress Notes (Signed)
BH MD/PA/NP OP Progress Note  Virtual Visit via Telephone Note  I connected with Jeremiah Roberts on 06/17/21 at  3:00 PM EDT by telephone and verified that I am speaking with the correct person using two identifiers.  Location: Patient: Home Provider: Clinic   I discussed the limitations, risks, security and privacy concerns of performing an evaluation and management service by telephone and the availability of in person appointments. I also discussed with the patient that there may be a patient responsible charge related to this service. The patient expressed understanding and agreed to proceed.  Follow Up Instructions:  I discussed the assessment and treatment plan with the patient. The patient was provided an opportunity to ask questions and all were answered. The patient agreed with the plan and demonstrated an understanding of the instructions.   The patient was advised to call back or seek an in-person evaluation if the symptoms worsen or if the condition fails to improve as anticipated.  I provided 30 minutes of non-face-to-face time during this encounter.  Meta Hatchet, PA    06/22/2021 6:49 PM Jeremiah Roberts  MRN:  [id number]  Chief Complaint: Follow up and medication management  HPI:   Jeremiah Roberts is a 30 year old male with a past psychiatric history significant for major depressive disorder and anxiety who presents to [facility] via virtual telephone visit for follow-up and medication engagement.  Patient is currently being managed on the following medications:  Hydroxyzine 25 mg 3 times daily as needed Mirtazapine 15 mg at bedtime  Patient reports no issues or concerns regarding his current medication regimen.  Patient denies the need for dosage adjustments at this time and is requesting refills on all his medications following the conclusion of the encounter.  Patient states that he still has not started his buspirone and reports  that he does not have to take his hydroxyzine very often to manage his anxiety.  Patient denies being stressed out or high strung but states that he does think a lot about his wife's mom who is currently going through health issues.  Despite his mother-in-law's health issues, patient states that things have been going well overall and that he plans on taking a trip to see his daughter in [place] soon.  A GAD-7 screen was performed with the patient scoring a 5.  Patient is pleasant, calm, cooperative, and fully engaged in conversation during the encounter.  Patient states that he is feeling pretty darn good  Patient denies suicidal ideations.  In terms of homicidal ideations, patient states that sometimes he wants to kick people in the face because they make him mad but he states that he would never act on these thoughts.  Patient denies auditory or visual hallucinations and does not appear to be responding to internal/external stimuli.  Patient endorses good sleep and receives on average 9 to 10 hours of sleep each night.  Patient endorses good appetite and eats on average 3 meals per day.  Patient endorses alcohol consumption sparingly and has roughly 2 beers per week.  Patient endorses tobacco use and smokes on average 5 to 10 cigarettes/day.  Patient denies illicit drug use.  Visit Diagnosis:    ICD-10-CM   1. Anxiety state  F41.1 mirtazapine (REMERON) 15 MG tablet    hydrOXYzine (ATARAX/VISTARIL) 25 MG tablet    2. MDD (major depressive disorder), single episode, moderate (HCC)  F32.1 mirtazapine (REMERON) 15 MG tablet      Past Psychiatric History:  Major  depressive disorder Anxiety  Past Medical History:  Past Medical History:  Diagnosis Date   Acid reflux    History reviewed. No pertinent surgical history.  Family Psychiatric History:  Father - Alcoholic Younger Cousin - Schizophrenia, patient reports that his cousin was a "drug" baby who had some mental problems  Family History:  History reviewed. No pertinent family history.  Social History:  Social History   Socioeconomic History   Marital status: Married    Spouse name: Not on file   Number of children: Not on file   Years of education: Not on file   Highest education level: Not on file  Occupational History   Not on file  Tobacco Use   Smoking status: Former    Pack years: 0.00    Types: Cigars   Smokeless tobacco: Never   Tobacco comments:    repotrs quit cold [place] beginning of Oct 2021  Vaping Use   Vaping Use: Never used  Substance and Sexual Activity   Alcohol use: Never    Comment: occasionally   Drug use: Never   Sexual activity: Not on file  Other Topics Concern   Not on file  Social History Narrative   Not on file   Social Determinants of Health   Financial Resource Strain: Not on file  Food Insecurity: Not on file  Transportation Needs: Not on file  Physical Activity: Not on file  Stress: Not on file  Social Connections: Not on file    Allergies:  Allergies  Allergen Reactions   Sertraline Hcl Diarrhea and Nausea And Vomiting    Metabolic Disorder Labs: No results found for: HGBA1C, MPG No results found for: PROLACTIN No results found for: CHOL, TRIG, HDL, CHOLHDL, VLDL, LDLCALC Lab Results  Component Value Date   TSH 1.270 10/22/2020    Therapeutic Level Labs: No results found for: LITHIUM No results found for: VALPROATE No components found for:  CBMZ  Current Medications: Current Outpatient Medications  Medication Sig Dispense Refill   busPIRone (BUSPAR) 5 MG tablet Take 1 tablet (5 mg total) by mouth in the morning and at bedtime. 60 tablet 1   famotidine (PEPCID) 40 MG tablet Take 1 tablet (40 mg total) by mouth 2 (two) times daily. 60 tablet 0   hydrOXYzine (ATARAX/VISTARIL) 25 MG tablet Take 1 tablet (25 mg total) by mouth 3 (three) times daily as needed for anxiety. 60 tablet 1   mirtazapine (REMERON) 15 MG tablet Take 1 tablet (15 mg total) by mouth at  bedtime. 30 tablet 1   ondansetron (ZOFRAN ODT) 4 MG disintegrating tablet Take 1 tablet (4 mg total) by mouth every 8 (eight) hours as needed for nausea or vomiting. 20 tablet 0   No current facility-administered medications for this visit.     Musculoskeletal: Strength & Muscle Tone: Unable to assess due to telemedicine visit Gait & Station: Unable to assess due to telemedicine visit Patient leans: Unable to assess due to telemedicine visit  Psychiatric Specialty Exam: Review of Systems  Psychiatric/Behavioral:  Negative for decreased concentration, dysphoric mood, hallucinations, self-injury, sleep disturbance and suicidal ideas. The patient is not nervous/anxious and is not hyperactive.    There were no vitals taken for this visit.There is no height or weight on file to calculate BMI.  General Appearance: Unable to assess due to telemedicine visit  Eye Contact:  Unable to assess due to telemedicine visit  Speech:  Clear and Coherent and Normal Rate  Volume:  Normal  Mood:  Euthymic  Affect:  Appropriate  Thought Process:  Coherent and Descriptions of Associations: Intact  Orientation:  Full (Time, Place, and Person)  Thought Content: WDL   Suicidal Thoughts:  No  Homicidal Thoughts:  Yes.  without intent/plan  Memory:  Immediate;   Good Recent;   Good Remote;   Good  Judgement:  Good  Insight:  Fair  Psychomotor Activity:  Normal  Concentration:  Concentration: Good and Attention Span: Good  Recall:  Good  Fund of Knowledge: Good  Language: Good  Akathisia:  NA  Handed:  Right  AIMS (if indicated): not done  Assets:  Communication Skills Desire for Improvement Intimacy Social Support Vocational/Educational  ADL's:  Intact  Cognition: WNL  Sleep:  Good   Screenings: AIMS    Flowsheet Row [facility]  AIMS Total Score 0      AUDIT    Flowsheet Row Ad[facility] (Dis[facility]  Alcohol Use Disorder Identification Test Final Score (AUDIT) 0      GAD-7    Flowsheet Row [facility]17/20[facility] Video Visit from 05/07/2021 in [facility] Office Visit from 02/11/2021 in [facility]  Total GAD-7 Score 5 2 4       PHQ2-9    Flowsheet Row Video Visit from 06/17/2021 in [facility] Video Visit from 05/07/2021 in [facility] Office Visit from 02/11/2021 in [facility]  PHQ-2 Total Score 0 0 1      Flowsheet Row Video Visit from 06/17/2021 in [facility] Video Visit from 05/07/2021 in [facility] Office Visit from 02/11/2021 in [facility]  C-SSRS RISK CATEGORY No Risk No Risk No Risk        Assessment and Plan:   Jeremiah Roberts is a 30 year old male with a past psychiatric history significant for major depressive disorder and anxiety who presents to [facility] via virtual telephone visit for follow-up and medication engagement.  Patient reports no issues or concerns regarding his current medication regimen.  Patient denies the need for dosage adjustments at this time and is requesting refills on all of his medications following the conclusion of the encounter.  Patient's medications to be e-prescribed to pharmacy of choice.  1. Anxiety state  - mirtazapine (REMERON) 15 MG tablet; Take 1 tablet (15 mg total) by mouth at bedtime.  Dispense: 30 tablet; Refill: 1 - hydrOXYzine (ATARAX/VISTARIL) 25 MG tablet; Take 1 tablet (25 mg total) by mouth 3 (three) times daily as needed for anxiety.  Dispense: 60 tablet; Refill: 1  2. MDD (major depressive disorder), single episode, moderate (HCC)  - mirtazapine  (REMERON) 15 MG tablet; Take 1 tablet (15 mg total) by mouth at bedtime.  Dispense: 30 tablet; Refill: 1  Patient to follow up in 2 months Provider spent a total of 30 minutes with the patient/reviewing patient's chart.  [facility], PA 06/22/2021, 6:49 PM

## 2021-08-19 ENCOUNTER — Encounter (HOSPITAL_COMMUNITY): Payer: Self-pay | Admitting: Physician Assistant

## 2021-08-19 ENCOUNTER — Telehealth (INDEPENDENT_AMBULATORY_CARE_PROVIDER_SITE_OTHER): Payer: No Payment, Other | Admitting: Physician Assistant

## 2021-08-19 ENCOUNTER — Other Ambulatory Visit: Payer: Self-pay

## 2021-08-19 DIAGNOSIS — F411 Generalized anxiety disorder: Secondary | ICD-10-CM | POA: Diagnosis not present

## 2021-08-19 DIAGNOSIS — F321 Major depressive disorder, single episode, moderate: Secondary | ICD-10-CM

## 2021-08-19 MED ORDER — MIRTAZAPINE 7.5 MG PO TABS
7.5000 mg | ORAL_TABLET | Freq: Every day | ORAL | 1 refills | Status: DC
Start: 2021-08-19 — End: 2021-11-04

## 2021-08-19 MED ORDER — HYDROXYZINE HCL 25 MG PO TABS: 25.0000 mg | ORAL_TABLET | Freq: Three times a day (TID) | ORAL | 1 refills | Status: DC | PRN

## 2021-08-19 NOTE — Progress Notes (Signed)
BH MD/PA/NP OP Progress Note  Virtual Visit via Telephone Note  I connected with Jeremiah Roberts on 08/19/21 at  2:30 PM EDT by telephone and verified that I am speaking with the correct person using two identifiers.  Location: Patient: Home Provider: Clinic   I discussed the limitations, risks, security and privacy concerns of performing an evaluation and management service by telephone and the availability of in person appointments. I also discussed with the patient that there may be a patient responsible charge related to this service. The patient expressed understanding and agreed to proceed.  Follow Up Instructions:  I discussed the assessment and treatment plan with the patient. The patient was provided an opportunity to ask questions and all were answered. The patient agreed with the plan and demonstrated an understanding of the instructions.   The patient was advised to call back or seek an in-person evaluation if the symptoms worsen or if the condition fails to improve as anticipated.  I provided 20 minutes of non-face-to-face time during this encounter.  Meta Hatchet, PA   08/19/2021 7:00 PM Jeremiah Roberts  MRN:  [id number]  Chief Complaint: Follow up and medication management  HPI:     Visit Diagnosis:    ICD-10-CM   1. Anxiety state  F41.1 mirtazapine (REMERON) 7.5 MG tablet    hydrOXYzine (ATARAX/VISTARIL) 25 MG tablet    2. MDD (major depressive disorder), single episode, moderate (HCC)  F32.1 mirtazapine (REMERON) 7.5 MG tablet      Past Psychiatric History:  Major depressive disorder Anxiety  Past Medical History:  Past Medical History:  Diagnosis Date   Acid reflux    History reviewed. No pertinent surgical history.  Family Psychiatric History:  Father - Alcoholic Younger Cousin - Schizophrenia, patient reports that his cousin was a "drug" baby who had some mental problems  Family History: History reviewed. No pertinent family history.  Social  History:  Social History   Socioeconomic History   Marital status: Married    Spouse name: Not on file   Number of children: Not on file   Years of education: Not on file   Highest education level: Not on file  Occupational History   Not on file  Tobacco Use   Smoking status: Former    Types: Cigars   Smokeless tobacco: Never   Tobacco comments:    repotrs quit cold [place] beginning of Oct 2021  Vaping Use   Vaping Use: Never used  Substance and Sexual Activity   Alcohol use: Never    Comment: occasionally   Drug use: Never   Sexual activity: Not on file  Other Topics Concern   Not on file  Social History Narrative   Not on file   Social Determinants of Health   Financial Resource Strain: Not on file  Food Insecurity: Not on file  Transportation Needs: Not on file  Physical Activity: Not on file  Stress: Not on file  Social Connections: Not on file    Allergies:  Allergies  Allergen Reactions   Sertraline Hcl Diarrhea and Nausea And Vomiting    Metabolic Disorder Labs: No results found for: HGBA1C, MPG No results found for: PROLACTIN No results found for: CHOL, TRIG, HDL, CHOLHDL, VLDL, LDLCALC Lab Results  Component Value Date   TSH 1.270 10/22/2020    Therapeutic Level Labs: No results found for: LITHIUM No results found for: VALPROATE No components found for:  CBMZ  Current Medications: Current Outpatient Medications  Medication Sig Dispense Refill  busPIRone (BUSPAR) 5 MG tablet Take 1 tablet (5 mg total) by mouth in the morning and at bedtime. 60 tablet 1   famotidine (PEPCID) 40 MG tablet Take 1 tablet (40 mg total) by mouth 2 (two) times daily. 60 tablet 0   hydrOXYzine (ATARAX/VISTARIL) 25 MG tablet Take 1 tablet (25 mg total) by mouth 3 (three) times daily as needed for anxiety. 60 tablet 1   mirtazapine (REMERON) 7.5 MG tablet Take 1 tablet (7.5 mg total) by mouth at bedtime. 30 tablet 1   ondansetron (ZOFRAN ODT) 4 MG disintegrating tablet  Take 1 tablet (4 mg total) by mouth every 8 (eight) hours as needed for nausea or vomiting. 20 tablet 0   No current facility-administered medications for this visit.     Musculoskeletal: Strength & Muscle Tone: Unable to assess due to telemedicine visit Gait & Station: Unable to assess due to telemedicine visit Patient leans: Unable to assess due to telemedicine visit  Psychiatric Specialty Exam: Review of Systems  Psychiatric/Behavioral:  Positive for sleep disturbance. Negative for decreased concentration, dysphoric mood, hallucinations, self-injury and suicidal ideas. The patient is not nervous/anxious and is not hyperactive.    There were no vitals taken for this visit.There is no height or weight on file to calculate BMI.  General Appearance: Unable to assess due to telemedicine visit  Eye Contact:  Unable to assess due to telemedicine visit  Speech:  Clear and Coherent and Normal Rate  Volume:  Normal  Mood:  Euthymic  Affect:  Appropriate  Thought Process:  Coherent, Goal Directed, and Descriptions of Associations: Intact  Orientation:  Full (Time, Place, and Person)  Thought Content: WDL   Suicidal Thoughts:  No  Homicidal Thoughts:  No  Memory:  Immediate;   Good Recent;   Good Remote;   Good  Judgement:  Good  Insight:  Good  Psychomotor Activity:  Normal  Concentration:  Concentration: Good and Attention Span: Good  Recall:  Good  Fund of Knowledge: Good  Language: Good  Akathisia:  NA  Handed:  Right  AIMS (if indicated): not done  Assets:  Communication Skills Desire for Improvement Housing Social Support Vocational/Educational  ADL's:  Intact  Cognition: WNL  Sleep:  Fair   Screenings: AIMS    Flowsheet Row [facility]  AIMS Total Score 0      AUDIT    Flowsheet Row Ad[facility]  Alcohol Use  Disorder Identification Test Final Score (AUDIT) 0      GAD-7    Flowsheet Row Video Visit from 08/19/2021 in [facility] Video Visit from 06/17/2021 in [facility] Video Visit from 05/07/2021 in [facility] Office Visit from 02/11/2021 in [facility]  Total GAD-7 Score 1 5 2 4       PHQ2-9    Flowsheet Row Video Visit from 08/19/2021 in [facility] Video Visit from 06/17/2021 in [facility] Video Visit from 05/07/2021 in [facility] Office Visit from 02/11/2021 in [place]  PHQ-2 Total Score 0 0 0 1      Flowsheet Row Video Visit from 08/19/2021 in [facility] Video Visit from 06/17/2021 in [facility] Video Visit from 05/07/2021 in [facility]  C-SSRS RISK CATEGORY  No Risk No Risk No Risk        Assessment and Plan:     1. Anxiety state  - mirtazapine (REMERON) 7.5 MG tablet; Take 1 tablet (7.5 mg total) by mouth at bedtime.  Dispense: 30 tablet; Refill: 1 - hydrOXYzine (ATARAX/VISTARIL) 25 MG tablet; Take 1 tablet (25 mg total) by mouth 3 (three) times daily as needed for anxiety.  Dispense: 60 tablet; Refill: 1  2. MDD (major depressive disorder), single episode, moderate (HCC)  - mirtazapine (REMERON) 7.5 MG tablet; Take 1 tablet (7.5 mg total) by mouth at bedtime.  Dispense: 30 tablet; Refill: 1  Patient to follow up in 2 months Provider spent a total of 20 minutes with the patient/reviewing patient's chart  Meta Hatchet, PA 08/19/2021, 7:00 PM

## 2021-11-04 ENCOUNTER — Encounter (HOSPITAL_COMMUNITY): Payer: Self-pay | Admitting: Family

## 2021-11-04 ENCOUNTER — Telehealth (INDEPENDENT_AMBULATORY_CARE_PROVIDER_SITE_OTHER): Payer: No Payment, Other | Admitting: Family

## 2021-11-04 DIAGNOSIS — F321 Major depressive disorder, single episode, moderate: Secondary | ICD-10-CM

## 2021-11-04 DIAGNOSIS — F411 Generalized anxiety disorder: Secondary | ICD-10-CM

## 2021-11-04 MED ORDER — HYDROXYZINE HCL 25 MG PO TABS: 25.0000 mg | ORAL_TABLET | Freq: Three times a day (TID) | ORAL | 0 refills | Status: DC | PRN

## 2021-11-04 MED ORDER — BUSPIRONE HCL 5 MG PO TABS: 5.0000 mg | ORAL_TABLET | Freq: Two times a day (BID) | ORAL | 1 refills | Status: AC

## 2021-11-04 MED ORDER — MIRTAZAPINE 7.5 MG PO TABS
7.5000 mg | ORAL_TABLET | Freq: Every day | ORAL | 0 refills | Status: DC
Start: 2021-11-04 — End: 2024-01-04

## 2021-11-04 NOTE — Progress Notes (Signed)
Virtual Visit via Telephone Note  I connected with [name] on 11/04/21 at  2:30 PM EST by telephone and verified that I am speaking with the correct person using two identifiers.  Location: Patient: home Provider: office   I discussed the limitations, risks, security and privacy concerns of performing an evaluation and management service by telephone and the availability of in person appointments. I also discussed with the patient that there may be a patient responsible charge related to this service. The patient expressed understanding and agreed to proceed.   I discussed the assessment and treatment plan with the patient. The patient was provided an opportunity to ask questions and all were answered. The patient agreed with the plan and demonstrated an understanding of the instructions.   The patient was advised to call back or seek an in-person evaluation if the symptoms worsen or if the condition fails to improve as anticipated.  I provided 15 minutes of non-face-to-face time during this encounter.   Oneta Rack, NP   BH MD/PA/NP OP Progress Note  11/04/2021 2:23 PM Thedore Pickel  MRN:  [id number]  Chief Complaint:  Onalee Hua reported "  I have a lot going on these days. "  Jeremi was evaluated via telephonically for medication management.  He reports he is currently prescribed hydroxyzine and BuSpar which he reports taking and tolerating well.  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  States slight anxiety with picking his daughter up from [place] for the Thanksgiving holiday early next week but is looking forward to spending time with his 32 year old.    Treysen reports a good appetite.  States he is resting well throughout the night.  He denied suicidal or homicidal ideations.  Denies auditory or visual hallucinations.  Rates his depression and anxiety 3 out of 10 with 10 being the worst.    States he has attempted to take  Remeron however felt this medication was  too strong so he opted to take this medication intermittently which he reports he discussed with previous provider.  We will make medication refills available.  Patient to follow-up 6 weeks.  Support, encouragement reassurance was provided.   HPI:  Johndavid  Visit Diagnosis:    ICD-10-CM   1. Anxiety state  F41.1 busPIRone (BUSPAR) 5 MG tablet    mirtazapine (REMERON) 7.5 MG tablet    hydrOXYzine (ATARAX/VISTARIL) 25 MG tablet    2. MDD (major depressive disorder), single episode, moderate (HCC)  F32.1 mirtazapine (REMERON) 7.5 MG tablet      Past Psychiatric History:  Past Medical History:  Past Medical History:  Diagnosis Date   Acid reflux    No past surgical history on file.  Family Psychiatric History:  Family History: No family history on file.  Social History:  Social History   Socioeconomic History   Marital status: Married    Spouse name: Not on file   Number of children: Not on file   Years of education: Not on file   Highest education level: Not on file  Occupational History   Not on file  Tobacco Use   Smoking status: Former    Types: Cigars   Smokeless tobacco: Never   Tobacco comments:    repotrs quit cold [place] beginning of Oct 2021  Vaping Use   Vaping Use: Never used  Substance and Sexual Activity   Alcohol use: Never    Comment: occasionally   Drug use: Never   Sexual activity: Not on  file  Other Topics Concern   Not on file  Social History Narrative   Not on file   Social Determinants of Health   Financial Resource Strain: Not on file  Food Insecurity: Not on file  Transportation Needs: Not on file  Physical Activity: Not on file  Stress: Not on file  Social Connections: Not on file    Allergies:  Allergies  Allergen Reactions   Sertraline Hcl Diarrhea and Nausea And Vomiting    Metabolic Disorder Labs: No results found for: HGBA1C, MPG No results found for: PROLACTIN No results found for: CHOL, TRIG, HDL, CHOLHDL, VLDL,  LDLCALC Lab Results  Component Value Date   TSH 1.270 10/22/2020    Therapeutic Level Labs: No results found for: LITHIUM No results found for: VALPROATE No components found for:  CBMZ  Current Medications: Current Outpatient Medications  Medication Sig Dispense Refill   busPIRone (BUSPAR) 5 MG tablet Take 1 tablet (5 mg total) by mouth in the morning and at bedtime. 60 tablet 1   famotidine (PEPCID) 40 MG tablet Take 1 tablet (40 mg total) by mouth 2 (two) times daily. 60 tablet 0   hydrOXYzine (ATARAX/VISTARIL) 25 MG tablet Take 1 tablet (25 mg total) by mouth 3 (three) times daily as needed for anxiety. 60 tablet 0   mirtazapine (REMERON) 7.5 MG tablet Take 1 tablet (7.5 mg total) by mouth at bedtime. 30 tablet 0   ondansetron (ZOFRAN ODT) 4 MG disintegrating tablet Take 1 tablet (4 mg total) by mouth every 8 (eight) hours as needed for nausea or vomiting. 20 tablet 0   No current facility-administered medications for this visit.     Musculoskeletal:  Psychiatric Specialty Exam: Review of Systems  There were no vitals taken for this visit.There is no height or weight on file to calculate BMI.  General Appearance: NA  Eye Contact:  NA  Speech:  Clear and Coherent  Volume:  Normal  Mood:  Anxious and Depressed  Affect:  Congruent  Thought Process:  Coherent  Orientation:  Full (Time, Place, and Person)  Thought Content: Logical   Suicidal Thoughts:  No  Homicidal Thoughts:  No  Memory:  Immediate;   Fair Recent;   Fair  Judgement:  Good  Insight:  Good  Psychomotor Activity:  Normal  Concentration:  Concentration: Good  Recall:  Good  Fund of Knowledge: Good  Language: Good  Akathisia:  No  Handed:  Right  AIMS (if indicated): done  Assets:  Desire for Improvement Social Support  ADL's:  Intact  Cognition: WNL  Sleep:  Good   Screenings: AIMS    Flowsheet Row [facility]  AIMS  Total Score 0      AUDIT    Flowsheet Row Ad[facility]  Alcohol Use Disorder Identification Test Final Score (AUDIT) 0      GAD-7    Flowsheet Row Video Visit from 08/19/2021 in [facility] Video Visit from 06/17/2021 in [facility] Video Visit from 05/07/2021 in [facility] Office Visit from 02/11/2021 in [facility]  Total GAD-7 Score 1 5 2 4       PHQ2-9    Flowsheet Row Video Visit from 08/19/2021 in [facility] Video Visit from 06/17/2021 in [facility] Video Visit from 05/07/2021 in [facility] Office Visit from 02/11/2021 in [facility]  PHQ-2 Total Score 0 0 0 1      Flowsheet Row Video Visit from 08/19/2021 in [facility] Video Visit from 06/17/2021 in [facility] Video Visit from 05/07/2021 in [facility]  C-SSRS RISK CATEGORY No Risk No Risk No Risk        Assessment and Plan: Ameet Sandy is a 30 year old male with a charted history of generalized anxiety disorder and major depression.  He reports overall his mood has improved and is anxiety has decreased.  Does reports some psychosocial stressors but have been manageable.  Discussed represcribing medications at current dose as he reports taking and tolerating well.  Patient to follow-up 6 weeks for medication management.  Generalized anxiety disorder Major depressive disorder  Continue hydroxyzine 25 mg p.o. 3 times daily as needed Continues Buspar 5 mg by mouth two times daily Continue Remeron 7.5 mg nightly PRN  Patient to keep f/u appointment 6 weeks  Oneta Rack, NP 11/04/2021, 2:23 PM

## 2021-12-26 ENCOUNTER — Telehealth (HOSPITAL_COMMUNITY): Payer: No Payment, Other | Admitting: Physician Assistant

## 2021-12-26 ENCOUNTER — Encounter (HOSPITAL_COMMUNITY): Payer: Self-pay

## 2024-01-04 ENCOUNTER — Other Ambulatory Visit (HOSPITAL_COMMUNITY): Payer: Self-pay | Admitting: Psychiatry

## 2024-01-04 DIAGNOSIS — F411 Generalized anxiety disorder: Secondary | ICD-10-CM

## 2024-01-04 DIAGNOSIS — F321 Major depressive disorder, single episode, moderate: Secondary | ICD-10-CM

## 2024-01-04 MED ORDER — HYDROXYZINE HCL 25 MG PO TABS: 25.0000 mg | ORAL_TABLET | Freq: Three times a day (TID) | ORAL | 1 refills | Status: DC | PRN

## 2024-01-04 MED ORDER — MIRTAZAPINE 7.5 MG PO TABS: 7.5000 mg | ORAL_TABLET | Freq: Every day | ORAL | 1 refills | Status: DC

## 2024-01-06 ENCOUNTER — Ambulatory Visit (HOSPITAL_COMMUNITY): Payer: Self-pay | Admitting: Registered Nurse

## 2024-01-06 ENCOUNTER — Encounter (HOSPITAL_COMMUNITY): Payer: Self-pay | Admitting: Registered Nurse

## 2024-01-06 DIAGNOSIS — F410 Panic disorder [episodic paroxysmal anxiety] without agoraphobia: Secondary | ICD-10-CM

## 2024-01-06 DIAGNOSIS — F411 Generalized anxiety disorder: Secondary | ICD-10-CM

## 2024-01-06 DIAGNOSIS — F330 Major depressive disorder, recurrent, mild: Secondary | ICD-10-CM

## 2024-01-06 HISTORY — DX: Major depressive disorder, recurrent, mild: F33.0

## 2024-01-06 MED ORDER — MIRTAZAPINE 7.5 MG PO TABS: 7.5000 mg | ORAL_TABLET | Freq: Every day | ORAL | 0 refills | Status: AC

## 2024-01-06 MED ORDER — HYDROXYZINE HCL 25 MG PO TABS: 25.0000 mg | ORAL_TABLET | Freq: Three times a day (TID) | ORAL | 0 refills | Status: AC | PRN

## 2024-01-06 NOTE — Patient Instructions (Addendum)
Call 911 or present to the nearest emergency room should you experience any suicidal/homicidal ideation, auditory/visual/hallucinations, or detrimental worsening of your mental health.   Virtual Visit  Please be sure to join your visit at least 10 minutes before your scheduled time. Click here for a flyer with additional tips to help you have a successful video visit.  When it is time for your visit, click the green Begin Video Visit button above.   Be sure to select "allow" for your device to access the microphone and camera for your visit.   You will then be connected to the virtual waiting room. Your provider will be with you shortly.  If you have any issues connecting, or need assistance, please contact our MyChart service desk [phone])   In order to ensure the best quality for your visit you will need to use either of the following internet browsers: [employer] or [employer].
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
